# Patient Record
Sex: Male | Born: 1945 | Race: White | Hispanic: No | Marital: Married
Health system: Southern US, Community
[De-identification: ages and names within clinical notes are randomized; demographics above are authoritative.]

---

## 2003-10-13 ENCOUNTER — Ambulatory Visit (HOSPITAL_COMMUNITY): Admission: RE | Admit: 2003-10-13 | Discharge: 2003-10-13 | Payer: Self-pay | Admitting: Orthopedic Surgery

## 2004-02-24 ENCOUNTER — Ambulatory Visit (HOSPITAL_COMMUNITY): Admission: RE | Admit: 2004-02-24 | Discharge: 2004-02-24 | Payer: Self-pay | Admitting: Orthopedic Surgery

## 2011-10-03 ENCOUNTER — Encounter (INDEPENDENT_AMBULATORY_CARE_PROVIDER_SITE_OTHER): Payer: Self-pay | Admitting: *Deleted

## 2012-01-08 ENCOUNTER — Ambulatory Visit: Payer: Self-pay | Admitting: Pain Medicine

## 2012-01-08 LAB — BASIC METABOLIC PANEL
Anion Gap: 5 — ABNORMAL LOW (ref 7–16)
BUN: 4 mg/dL — ABNORMAL LOW (ref 7–18)
Co2: 31 mmol/L (ref 21–32)
Creatinine: 0.84 mg/dL (ref 0.60–1.30)
EGFR (African American): >60
EGFR (Non-African Amer.): >60
Glucose: 106 mg/dL — ABNORMAL HIGH (ref 65–99)
Sodium: 138 mmol/L (ref 136–145)

## 2012-01-28 ENCOUNTER — Ambulatory Visit: Payer: Self-pay | Admitting: Pain Medicine

## 2012-02-06 ENCOUNTER — Ambulatory Visit: Payer: Self-pay | Admitting: Pain Medicine

## 2014-07-05 NOTE — Op Note (Signed)
PATIENT NAME:  Dale Jones, Dale Jones MR#:  [id number] DATE OF BIRTH:  1945-03-23  DATE OF PROCEDURE:  01/28/2012  Location: Operating Room Referring Physician: Neomia Dear, MD Procedure by: Kathlen Brunswick Dossie Arbour, M.D.  Note: This is the case of a 69 year old morbidly obese white male patient who comes into same day surgery today for placement of a bilateral lumbar spinal cord stimulator as an outpatient. He previously had a trial done by Dr. Niel Hummer with excellent results and he has decided to proceed with a permanent implant.   Procedure(s):  1. Permanent implantation of Lumbar Epidural, Double Percutaneous Neurostimulator Leads (16 electrode array). 2. Implantation of Epidural Neurostimulator Generator. 3. Fluoroscopic Needle Guidance 4. Intraoperative Analysis and Programming. 5. Postoperative Analysis and Programming. 6. Moderate Conscious Sedation by the American Anesthesia Team.  Surgeon: Kathlen Brunswick. Dossie Arbour, M.D. Side of implant: Bilateral Top electrode tip level: Mid body of T7 (bilaterally). 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 Position: Prone.  Prepping solution: DuraPrep Area prepped: The thoracolumbar and sacral areas, were prepped with a broad-spectrum topical antiseptic microbicide. Target area: Lumbar Epidural Space, around the T7-8 vertebral body, for the electrode tip. Insertion site is the L2-3 intervertebral space. Level entered: L2-3. Number of attempts: One.  Infection Control: Standard Universal Precautions taken (Respiratory Hygiene/Cough Etiquette; Mouth, nose, eye protection; Hand Hygiene; Personal protective equipment (PPE); safe injection practices; and use of masks and disposable sterile surgical gloves) as recommended by the Department of [place] for Disease Control and Prevention (CDC).  Safety Measures: Allergies were reviewed. Appropriate site, procedure, and patient were confirmed by following the  Joint Commission's Universal Protocol (UP.01.01.01). The patient was asked to confirm marked site and procedure, before commencing. The patient was asked about blood thinners, or active infections, both of which were denied. No attempt was made at seeking any paresthesias. Aspiration looking for blood return was conducted prior to injecting. At no point did we inject any substances, as a needle was being advanced.  Pre-procedure Assessment:  A medical history and physical exam were obtained. Relevant documentation was reviewed and verified. Prior to the procedure, the patient was provided with an Audio CD, as well as written information on the procedure, including side-effects, and possible complications. Under the influence of no sedatives, a verbal, as well as a written informed consent were obtained, after having provided information on the risks and possible complications. To fulfill our ethical and legal obligations, as recommended by the American Medical Association's Code of Ethics, we have provided information to the patient about our clinical impression; the nature and purpose of an available treatment or procedure; the risks and benefits of an available treatment or procedure; alternatives; the risk and benefits of the alternative treatment or procedure; and the risks and benefits of not receiving or undergoing a treatment or procedure. The patient was provided information about the risks and possible complications associated with the procedure. These include, but not limited to, failure to achieve desired goals, infection, bleeding, organ or nerve damage, allergic reactions, paralysis, and death. In addition, the patient was informed that Medicine is not an exact science; therefore, there is also the possibility of unforeseen risks and possible complications that may result in a catastrophic outcome. The patient indicated having understood very clearly.  We have given the patient no guarantees and we  have made no promises. Ample time was given to the patient to ask questions, all of which were answered, to the patient's satisfaction, before proceeding. The patient understands that by signing our informed consent  form, they understand and accept the risks and the fact that it is impossible to predict all possible complications. Baseline vital signs were taken and the medical assessment was completed. Verification of the correct person, correct site (including marking of site), and correct procedure were performed and confirmed by the patient. Baseline vital signs were taken and the initial assessment was completed. Verification of the correct person, correct site (including marking of site), and correct procedure were performed and confirmed by the patient, in the form of a "Time Out".  Monitoring: The patient was monitored in the usual manner, using NIBPM, ECG, and pulse oximetry.  IV Access:  An IV access was obtained and secured.  Analgesia:  Moderate (Conscious) Intravenous sedation: Consent was obtained before administering any sedation. Availability of a responsible, adult driver, and NPO status confirmed. Meaningful verbal contact was maintained, with the patient at all times during the procedure. ASA Sedation Guidelines followed. For specifics on pharmacological type and quantity of sedation, please see nursing chart.  Prophylactic Antibiotics: Cefazolin (1st generation cephalosporin) 1 gm IVPB.  Local Anesthesia: Lidocaine 1%. The skin over the procedure site were infiltrated using a 3 ml Luer-Lok syringe with a 0.5 inch, 25-G needle. Deeper tissues were infiltrated using a 3.0 inch, 22-G spinal needle, under fluoroscopic guidance.  Fluoroscopy: The patient was taken to the operative suite, where the patient was placed in position for the procedure, over the fluoroscopy compatible table. Fluoroscopy was manipulated, using "Tunnel Vision Technique", to obtain the best possible view of the  target area, on the affected side. Parallax error was corrected before commencing the procedure. Gabor Racz's "Direction-Depth-Direction" technique was used to introduce the procedural needle under continuous pulsed fluoroscopic guidance. Once the target was reached, antero-posterior and lateral fluoroscopic views were taken to confirm needle placement in two planes.   Fluoroscopy time: Please see the patient's chart for details.  Description of the procedure: The procedure site was prepped using a broad-spectrum topical antiseptic. The area was then draped in the usual and standard manner. "Time-out" was performed as per JC Universal Protocol (UP.01.01.01).   A midline incision was made over the spinous processes, and hemostasis attained. The procedural needle was then introduced through the incision using Gabor Racz's "Direction-Depth-Direction" technique, under pulsed fluoroscopic guidance. No attempt was made at seeking a paresthesia. The paramidline approach was used to enter the posterior lumbar epidural space at a 30 degree angle, using "Loss-of-resistance Technique" with 3 ml of PF-NaCl (0.9% NSS) + 0.5 ml of air, in a 5 ml glass syringe, using a "loss-of-bounce technique", at the desired level. Correct needle placement was confirmed in the  antero-posterior and lateral fluoroscopic views. The epidural lead was gently introduced under real-time fluoroscopy, constantly assessing for pain or paresthesias, until the tip was observed to be at the target level, on the side ipsilateral to the pain. The placement of the second lead was accomplished in a similar fashion. Once the target was thought to have been reached, antero-posterior and lateral fluoroscopic views were taken to confirm electrode placement in two planes. Placement was tested until a comfortable stimulation pattern was observed over the usual painful area. Once the patient had assured [place] that the stimulation was in the correct pattern, and  distribution, we proceeded to remove the 15-G "Tuohy" epidural needle(s). This was done while observing the electrode tip(s) under real-time fluoroscopy to prevent movement. The patient was sedated again, and 1% lidocaine was infiltrated into the buttocks area, in order to create the generator pocket.  The extension was tunneled and connected to the generator. An impedance check was conducted after connecting all sections of the system. Once hemostasis was confirmed, both wounds were closed with Vicryl 2-0 after cleaning them with a solution containing 50:50 hydrogen peroxide and Betadine. Surgical staples were used to close the skin. The wounds were covered with sterile transparent bio-occlusive dressings, to easily assess any evidence of infection in the future.  The patient tolerated the entire procedure well. A repeat set of vitals were taken after the procedure and the patient was kept under observation until discharge criteria was met. The patient was provided with discharge instructions, including a section on how to identify potential problems. Should any problems arise concerning this procedure, the patient was given instructions to immediately contact us, without hesitation. The neurostimulator representative and I, both provided the patient with our Business cards containing our contact telephone numbers, and instructed the patient to contact either one of [place], at any time, should there be any problems or questions. In any case, we plan to contact the patient by telephone for a follow-up status report regarding this interventional procedure.  EBL: 10 mL.  Complications: No heme; no paresthesias.  Disposition: Return to clinics in 10-11 days for removal of staples and postoperative evaluation.  Additional Comments/Plan: None.  Equipment used:  1. Medtronic lead kit model W2976312, lot [id number]. 2. Medtronic lead kit model W2976312, lot [id number]. 3. Medtronic accessory kit model D2839973,  lot [id number].  4. Medtronic charging system model W4194017, serial [id number].  5. Medtronic accessory kit model D2839973, lot [id number].  6. Medtronic pocket sizer model T6711382, lot [id number]. 7. Medtronic patient programmer model J2399731, serial [id number]. 8. Medtronic accessory kit model Q5479962, lot [id number].  9. Medtronic accessory kit model Q5479962, lot [id number].  10. Medtronic generator model [id number], serial [id number].  Disclaimer: Medicine is not an Chief Strategy Officer. The only guarantee in medicine is that nothing is guaranteed. It is important to note that the decision to proceed with this intervention was based on the information collected from the patient. The Data and conclusions were drawn from the patient's questionnaire, the interview, and the physical examination. Because the information was provided in large part by the patient, it cannot be guaranteed that it has not been purposely or unconsciously manipulated. Every effort has been made to obtain as much relevant data as possible for this evaluation. It is important to note that the conclusions that lead to this procedure are derived in large part from the available data. Always take into account that the treatment will also be dependent on availability of resources and existing treatment guidelines, considered by other Pain Management Practitioners as being common knowledge and practice, at this time. For Medico-Legal purposes, it is also important to point out that variations in procedural techniques and pharmacological choices are the acceptable norm. The indications, contraindications, technique, and results of the above procedure should only be interpreted and judged by a Board-Certified Interventional Pain Specialist with extensive familiarity and expertise in the same exact procedure and technique, doing otherwise would be inappropriate and unethical. ____________________________ Kathlen Brunswick. Dossie Arbour, MD fan:slb D: 01/28/2012  15:57:00 ET T: 01/28/2012 17:14:32 ET JOB#: [id number]  cc: Tracey Hermance A. Dossie Arbour, MD, <Dictator> Neomia Dear, MD (FAX: [phone]) Gaspar Cola MD ELECTRONICALLY SIGNED 01/29/2012 16:31

## 2016-01-04 ENCOUNTER — Ambulatory Visit (INDEPENDENT_AMBULATORY_CARE_PROVIDER_SITE_OTHER): Payer: Medicare Other | Admitting: Otolaryngology

## 2016-01-04 DIAGNOSIS — H900 Conductive hearing loss, bilateral: Secondary | ICD-10-CM

## 2016-01-04 DIAGNOSIS — H6123 Impacted cerumen, bilateral: Secondary | ICD-10-CM | POA: Diagnosis not present

## 2016-05-02 ENCOUNTER — Ambulatory Visit (INDEPENDENT_AMBULATORY_CARE_PROVIDER_SITE_OTHER): Payer: Medicare Other | Admitting: Otolaryngology

## 2016-05-02 DIAGNOSIS — H6123 Impacted cerumen, bilateral: Secondary | ICD-10-CM

## 2016-07-29 ENCOUNTER — Ambulatory Visit (INDEPENDENT_AMBULATORY_CARE_PROVIDER_SITE_OTHER): Payer: Medicare Other | Admitting: Otolaryngology

## 2016-07-29 DIAGNOSIS — H6123 Impacted cerumen, bilateral: Secondary | ICD-10-CM | POA: Diagnosis not present

## 2016-12-02 ENCOUNTER — Ambulatory Visit (INDEPENDENT_AMBULATORY_CARE_PROVIDER_SITE_OTHER): Payer: Medicare Other | Admitting: Otolaryngology

## 2016-12-30 ENCOUNTER — Ambulatory Visit (INDEPENDENT_AMBULATORY_CARE_PROVIDER_SITE_OTHER): Payer: Medicare Other | Admitting: Otolaryngology

## 2017-03-17 ENCOUNTER — Ambulatory Visit (INDEPENDENT_AMBULATORY_CARE_PROVIDER_SITE_OTHER): Payer: Medicare Other | Admitting: Otolaryngology

## 2017-03-17 DIAGNOSIS — H6123 Impacted cerumen, bilateral: Secondary | ICD-10-CM | POA: Diagnosis not present

## 2017-06-12 ENCOUNTER — Ambulatory Visit (INDEPENDENT_AMBULATORY_CARE_PROVIDER_SITE_OTHER): Payer: Medicare Other | Admitting: Otolaryngology

## 2017-06-12 DIAGNOSIS — H6123 Impacted cerumen, bilateral: Secondary | ICD-10-CM | POA: Diagnosis not present

## 2017-09-04 ENCOUNTER — Ambulatory Visit (INDEPENDENT_AMBULATORY_CARE_PROVIDER_SITE_OTHER): Payer: Medicare Other | Admitting: Otolaryngology

## 2017-09-04 DIAGNOSIS — H6123 Impacted cerumen, bilateral: Secondary | ICD-10-CM

## 2017-12-04 ENCOUNTER — Ambulatory Visit (INDEPENDENT_AMBULATORY_CARE_PROVIDER_SITE_OTHER): Payer: Medicare Other | Admitting: Otolaryngology

## 2018-01-29 ENCOUNTER — Ambulatory Visit (INDEPENDENT_AMBULATORY_CARE_PROVIDER_SITE_OTHER): Payer: Medicare Other | Admitting: Otolaryngology

## 2018-01-29 DIAGNOSIS — H6123 Impacted cerumen, bilateral: Secondary | ICD-10-CM

## 2018-04-02 ENCOUNTER — Ambulatory Visit (INDEPENDENT_AMBULATORY_CARE_PROVIDER_SITE_OTHER): Payer: Medicare Other | Admitting: Otolaryngology

## 2018-04-02 DIAGNOSIS — H6123 Impacted cerumen, bilateral: Secondary | ICD-10-CM

## 2018-10-26 ENCOUNTER — Ambulatory Visit (INDEPENDENT_AMBULATORY_CARE_PROVIDER_SITE_OTHER): Payer: Medicare Other | Admitting: Otolaryngology

## 2018-11-26 ENCOUNTER — Ambulatory Visit (INDEPENDENT_AMBULATORY_CARE_PROVIDER_SITE_OTHER): Payer: Medicare Other | Admitting: Otolaryngology

## 2018-11-26 DIAGNOSIS — H6123 Impacted cerumen, bilateral: Secondary | ICD-10-CM | POA: Diagnosis not present

## 2019-04-08 DIAGNOSIS — M1990 Unspecified osteoarthritis, unspecified site: Secondary | ICD-10-CM | POA: Diagnosis not present

## 2019-04-08 DIAGNOSIS — I639 Cerebral infarction, unspecified: Secondary | ICD-10-CM | POA: Diagnosis not present

## 2019-04-08 DIAGNOSIS — M47816 Spondylosis without myelopathy or radiculopathy, lumbar region: Secondary | ICD-10-CM | POA: Diagnosis not present

## 2019-04-08 DIAGNOSIS — G8194 Hemiplegia, unspecified affecting left nondominant side: Secondary | ICD-10-CM | POA: Diagnosis not present

## 2019-04-08 DIAGNOSIS — R251 Tremor, unspecified: Secondary | ICD-10-CM | POA: Diagnosis not present

## 2019-05-09 DIAGNOSIS — G8194 Hemiplegia, unspecified affecting left nondominant side: Secondary | ICD-10-CM | POA: Diagnosis not present

## 2019-05-09 DIAGNOSIS — I639 Cerebral infarction, unspecified: Secondary | ICD-10-CM | POA: Diagnosis not present

## 2019-05-09 DIAGNOSIS — M1990 Unspecified osteoarthritis, unspecified site: Secondary | ICD-10-CM | POA: Diagnosis not present

## 2019-05-09 DIAGNOSIS — R251 Tremor, unspecified: Secondary | ICD-10-CM | POA: Diagnosis not present

## 2019-05-09 DIAGNOSIS — M47816 Spondylosis without myelopathy or radiculopathy, lumbar region: Secondary | ICD-10-CM | POA: Diagnosis not present

## 2019-05-27 DIAGNOSIS — I10 Essential (primary) hypertension: Secondary | ICD-10-CM | POA: Diagnosis not present

## 2019-05-27 DIAGNOSIS — R5383 Other fatigue: Secondary | ICD-10-CM | POA: Diagnosis not present

## 2019-05-27 DIAGNOSIS — Z79899 Other long term (current) drug therapy: Secondary | ICD-10-CM | POA: Diagnosis not present

## 2019-05-27 DIAGNOSIS — M79605 Pain in left leg: Secondary | ICD-10-CM | POA: Diagnosis not present

## 2019-05-27 DIAGNOSIS — I69351 Hemiplegia and hemiparesis following cerebral infarction affecting right dominant side: Secondary | ICD-10-CM | POA: Diagnosis not present

## 2019-05-27 DIAGNOSIS — Z299 Encounter for prophylactic measures, unspecified: Secondary | ICD-10-CM | POA: Diagnosis not present

## 2019-06-01 DIAGNOSIS — S7012XA Contusion of left thigh, initial encounter: Secondary | ICD-10-CM | POA: Diagnosis not present

## 2019-06-01 DIAGNOSIS — I10 Essential (primary) hypertension: Secondary | ICD-10-CM | POA: Diagnosis not present

## 2019-06-01 DIAGNOSIS — Z299 Encounter for prophylactic measures, unspecified: Secondary | ICD-10-CM | POA: Diagnosis not present

## 2019-06-02 DIAGNOSIS — E7800 Pure hypercholesterolemia, unspecified: Secondary | ICD-10-CM | POA: Diagnosis not present

## 2019-06-02 DIAGNOSIS — I10 Essential (primary) hypertension: Secondary | ICD-10-CM | POA: Diagnosis not present

## 2019-06-02 DIAGNOSIS — E119 Type 2 diabetes mellitus without complications: Secondary | ICD-10-CM | POA: Diagnosis not present

## 2019-06-02 DIAGNOSIS — M159 Polyosteoarthritis, unspecified: Secondary | ICD-10-CM | POA: Diagnosis not present

## 2019-06-06 DIAGNOSIS — R251 Tremor, unspecified: Secondary | ICD-10-CM | POA: Diagnosis not present

## 2019-06-06 DIAGNOSIS — I639 Cerebral infarction, unspecified: Secondary | ICD-10-CM | POA: Diagnosis not present

## 2019-06-06 DIAGNOSIS — M47816 Spondylosis without myelopathy or radiculopathy, lumbar region: Secondary | ICD-10-CM | POA: Diagnosis not present

## 2019-06-06 DIAGNOSIS — M1990 Unspecified osteoarthritis, unspecified site: Secondary | ICD-10-CM | POA: Diagnosis not present

## 2019-06-06 DIAGNOSIS — G8194 Hemiplegia, unspecified affecting left nondominant side: Secondary | ICD-10-CM | POA: Diagnosis not present

## 2019-06-09 DIAGNOSIS — M79605 Pain in left leg: Secondary | ICD-10-CM | POA: Diagnosis not present

## 2019-06-09 DIAGNOSIS — I8392 Asymptomatic varicose veins of left lower extremity: Secondary | ICD-10-CM | POA: Diagnosis not present

## 2019-06-09 DIAGNOSIS — S7012XA Contusion of left thigh, initial encounter: Secondary | ICD-10-CM | POA: Diagnosis not present

## 2019-06-10 DIAGNOSIS — Z85828 Personal history of other malignant neoplasm of skin: Secondary | ICD-10-CM | POA: Diagnosis not present

## 2019-06-10 DIAGNOSIS — R3912 Poor urinary stream: Secondary | ICD-10-CM | POA: Diagnosis not present

## 2019-06-10 DIAGNOSIS — Z7902 Long term (current) use of antithrombotics/antiplatelets: Secondary | ICD-10-CM | POA: Diagnosis not present

## 2019-06-10 DIAGNOSIS — Z7982 Long term (current) use of aspirin: Secondary | ICD-10-CM | POA: Diagnosis not present

## 2019-06-10 DIAGNOSIS — E039 Hypothyroidism, unspecified: Secondary | ICD-10-CM | POA: Diagnosis not present

## 2019-06-10 DIAGNOSIS — N390 Urinary tract infection, site not specified: Secondary | ICD-10-CM | POA: Diagnosis not present

## 2019-06-10 DIAGNOSIS — I10 Essential (primary) hypertension: Secondary | ICD-10-CM | POA: Diagnosis not present

## 2019-06-10 DIAGNOSIS — Z8673 Personal history of transient ischemic attack (TIA), and cerebral infarction without residual deficits: Secondary | ICD-10-CM | POA: Diagnosis not present

## 2019-06-10 DIAGNOSIS — R339 Retention of urine, unspecified: Secondary | ICD-10-CM | POA: Diagnosis not present

## 2019-06-10 DIAGNOSIS — Z8249 Family history of ischemic heart disease and other diseases of the circulatory system: Secondary | ICD-10-CM | POA: Diagnosis not present

## 2019-06-10 DIAGNOSIS — E860 Dehydration: Secondary | ICD-10-CM | POA: Diagnosis not present

## 2019-06-10 DIAGNOSIS — Z79899 Other long term (current) drug therapy: Secondary | ICD-10-CM | POA: Diagnosis not present

## 2019-06-10 DIAGNOSIS — Z5321 Procedure and treatment not carried out due to patient leaving prior to being seen by health care provider: Secondary | ICD-10-CM | POA: Diagnosis not present

## 2019-06-10 DIAGNOSIS — Z299 Encounter for prophylactic measures, unspecified: Secondary | ICD-10-CM | POA: Diagnosis not present

## 2019-06-24 DIAGNOSIS — E119 Type 2 diabetes mellitus without complications: Secondary | ICD-10-CM | POA: Diagnosis not present

## 2019-06-24 DIAGNOSIS — E7800 Pure hypercholesterolemia, unspecified: Secondary | ICD-10-CM | POA: Diagnosis not present

## 2019-06-24 DIAGNOSIS — M159 Polyosteoarthritis, unspecified: Secondary | ICD-10-CM | POA: Diagnosis not present

## 2019-06-24 DIAGNOSIS — I10 Essential (primary) hypertension: Secondary | ICD-10-CM | POA: Diagnosis not present

## 2019-07-05 DIAGNOSIS — Z299 Encounter for prophylactic measures, unspecified: Secondary | ICD-10-CM | POA: Diagnosis not present

## 2019-07-05 DIAGNOSIS — W19XXXA Unspecified fall, initial encounter: Secondary | ICD-10-CM | POA: Diagnosis not present

## 2019-07-05 DIAGNOSIS — I10 Essential (primary) hypertension: Secondary | ICD-10-CM | POA: Diagnosis not present

## 2019-07-19 DIAGNOSIS — R918 Other nonspecific abnormal finding of lung field: Secondary | ICD-10-CM | POA: Diagnosis not present

## 2019-07-19 DIAGNOSIS — I10 Essential (primary) hypertension: Secondary | ICD-10-CM | POA: Diagnosis not present

## 2019-07-19 DIAGNOSIS — Z6822 Body mass index (BMI) 22.0-22.9, adult: Secondary | ICD-10-CM | POA: Diagnosis not present

## 2019-07-19 DIAGNOSIS — R6521 Severe sepsis with septic shock: Secondary | ICD-10-CM | POA: Diagnosis not present

## 2019-07-19 DIAGNOSIS — B957 Other staphylococcus as the cause of diseases classified elsewhere: Secondary | ICD-10-CM | POA: Diagnosis not present

## 2019-07-19 DIAGNOSIS — M6281 Muscle weakness (generalized): Secondary | ICD-10-CM | POA: Diagnosis not present

## 2019-07-19 DIAGNOSIS — R109 Unspecified abdominal pain: Secondary | ICD-10-CM | POA: Diagnosis not present

## 2019-07-19 DIAGNOSIS — R031 Nonspecific low blood-pressure reading: Secondary | ICD-10-CM | POA: Diagnosis not present

## 2019-07-19 DIAGNOSIS — Z66 Do not resuscitate: Secondary | ICD-10-CM | POA: Diagnosis not present

## 2019-07-19 DIAGNOSIS — R2689 Other abnormalities of gait and mobility: Secondary | ICD-10-CM | POA: Diagnosis not present

## 2019-07-19 DIAGNOSIS — R404 Transient alteration of awareness: Secondary | ICD-10-CM | POA: Diagnosis not present

## 2019-07-19 DIAGNOSIS — I959 Hypotension, unspecified: Secondary | ICD-10-CM | POA: Diagnosis not present

## 2019-07-19 DIAGNOSIS — Z8673 Personal history of transient ischemic attack (TIA), and cerebral infarction without residual deficits: Secondary | ICD-10-CM | POA: Diagnosis not present

## 2019-07-19 DIAGNOSIS — R627 Adult failure to thrive: Secondary | ICD-10-CM | POA: Diagnosis not present

## 2019-07-19 DIAGNOSIS — R7881 Bacteremia: Secondary | ICD-10-CM | POA: Diagnosis not present

## 2019-07-19 DIAGNOSIS — E860 Dehydration: Secondary | ICD-10-CM | POA: Diagnosis not present

## 2019-07-19 DIAGNOSIS — Z87440 Personal history of urinary (tract) infections: Secondary | ICD-10-CM | POA: Diagnosis not present

## 2019-07-19 DIAGNOSIS — R1312 Dysphagia, oropharyngeal phase: Secondary | ICD-10-CM | POA: Diagnosis not present

## 2019-07-19 DIAGNOSIS — R0902 Hypoxemia: Secondary | ICD-10-CM | POA: Diagnosis not present

## 2019-07-19 DIAGNOSIS — E039 Hypothyroidism, unspecified: Secondary | ICD-10-CM | POA: Diagnosis not present

## 2019-07-19 DIAGNOSIS — A419 Sepsis, unspecified organism: Secondary | ICD-10-CM | POA: Diagnosis not present

## 2019-07-19 DIAGNOSIS — B9561 Methicillin susceptible Staphylococcus aureus infection as the cause of diseases classified elsewhere: Secondary | ICD-10-CM | POA: Diagnosis not present

## 2019-07-19 DIAGNOSIS — B9620 Unspecified Escherichia coli [E. coli] as the cause of diseases classified elsewhere: Secondary | ICD-10-CM | POA: Diagnosis not present

## 2019-07-19 DIAGNOSIS — B9562 Methicillin resistant Staphylococcus aureus infection as the cause of diseases classified elsewhere: Secondary | ICD-10-CM | POA: Diagnosis not present

## 2019-07-19 DIAGNOSIS — Z20822 Contact with and (suspected) exposure to covid-19: Secondary | ICD-10-CM | POA: Diagnosis not present

## 2019-07-19 DIAGNOSIS — Z7982 Long term (current) use of aspirin: Secondary | ICD-10-CM | POA: Diagnosis not present

## 2019-07-19 DIAGNOSIS — R531 Weakness: Secondary | ICD-10-CM | POA: Diagnosis not present

## 2019-07-19 DIAGNOSIS — I69391 Dysphagia following cerebral infarction: Secondary | ICD-10-CM | POA: Diagnosis not present

## 2019-07-19 DIAGNOSIS — N179 Acute kidney failure, unspecified: Secondary | ICD-10-CM | POA: Diagnosis not present

## 2019-07-19 DIAGNOSIS — Z4659 Encounter for fitting and adjustment of other gastrointestinal appliance and device: Secondary | ICD-10-CM | POA: Diagnosis not present

## 2019-07-19 DIAGNOSIS — Z7401 Bed confinement status: Secondary | ICD-10-CM | POA: Diagnosis not present

## 2019-07-19 DIAGNOSIS — E43 Unspecified severe protein-calorie malnutrition: Secondary | ICD-10-CM | POA: Diagnosis not present

## 2019-07-19 DIAGNOSIS — N390 Urinary tract infection, site not specified: Secondary | ICD-10-CM | POA: Diagnosis not present

## 2019-07-19 DIAGNOSIS — D72829 Elevated white blood cell count, unspecified: Secondary | ICD-10-CM | POA: Diagnosis not present

## 2019-07-19 DIAGNOSIS — E870 Hyperosmolality and hypernatremia: Secondary | ICD-10-CM | POA: Diagnosis not present

## 2019-07-19 DIAGNOSIS — Z931 Gastrostomy status: Secondary | ICD-10-CM | POA: Diagnosis not present

## 2019-07-19 DIAGNOSIS — R1310 Dysphagia, unspecified: Secondary | ICD-10-CM | POA: Diagnosis not present

## 2019-07-19 DIAGNOSIS — Z7902 Long term (current) use of antithrombotics/antiplatelets: Secondary | ICD-10-CM | POA: Diagnosis not present

## 2019-07-30 DIAGNOSIS — Z931 Gastrostomy status: Secondary | ICD-10-CM | POA: Diagnosis not present

## 2019-07-30 DIAGNOSIS — G8929 Other chronic pain: Secondary | ICD-10-CM | POA: Diagnosis not present

## 2019-07-30 DIAGNOSIS — I10 Essential (primary) hypertension: Secondary | ICD-10-CM | POA: Diagnosis not present

## 2019-07-30 DIAGNOSIS — A419 Sepsis, unspecified organism: Secondary | ICD-10-CM | POA: Diagnosis not present

## 2019-07-30 DIAGNOSIS — I69351 Hemiplegia and hemiparesis following cerebral infarction affecting right dominant side: Secondary | ICD-10-CM | POA: Diagnosis not present

## 2019-07-30 DIAGNOSIS — R627 Adult failure to thrive: Secondary | ICD-10-CM | POA: Diagnosis not present

## 2019-07-30 DIAGNOSIS — M6281 Muscle weakness (generalized): Secondary | ICD-10-CM | POA: Diagnosis not present

## 2019-07-30 DIAGNOSIS — I69391 Dysphagia following cerebral infarction: Secondary | ICD-10-CM | POA: Diagnosis not present

## 2019-07-30 DIAGNOSIS — E870 Hyperosmolality and hypernatremia: Secondary | ICD-10-CM | POA: Diagnosis not present

## 2019-07-30 DIAGNOSIS — Z0000 Encounter for general adult medical examination without abnormal findings: Secondary | ICD-10-CM | POA: Diagnosis not present

## 2019-07-30 DIAGNOSIS — N179 Acute kidney failure, unspecified: Secondary | ICD-10-CM | POA: Diagnosis not present

## 2019-07-30 DIAGNOSIS — N390 Urinary tract infection, site not specified: Secondary | ICD-10-CM | POA: Diagnosis not present

## 2019-07-30 DIAGNOSIS — R1312 Dysphagia, oropharyngeal phase: Secondary | ICD-10-CM | POA: Diagnosis not present

## 2019-07-30 DIAGNOSIS — E43 Unspecified severe protein-calorie malnutrition: Secondary | ICD-10-CM | POA: Diagnosis not present

## 2019-07-30 DIAGNOSIS — Z7401 Bed confinement status: Secondary | ICD-10-CM | POA: Diagnosis not present

## 2019-07-30 DIAGNOSIS — B9620 Unspecified Escherichia coli [E. coli] as the cause of diseases classified elsewhere: Secondary | ICD-10-CM | POA: Diagnosis not present

## 2019-07-30 DIAGNOSIS — Z299 Encounter for prophylactic measures, unspecified: Secondary | ICD-10-CM | POA: Diagnosis not present

## 2019-07-30 DIAGNOSIS — R2689 Other abnormalities of gait and mobility: Secondary | ICD-10-CM | POA: Diagnosis not present

## 2019-07-30 DIAGNOSIS — D692 Other nonthrombocytopenic purpura: Secondary | ICD-10-CM | POA: Diagnosis not present

## 2019-07-30 DIAGNOSIS — E039 Hypothyroidism, unspecified: Secondary | ICD-10-CM | POA: Diagnosis not present

## 2019-08-10 DIAGNOSIS — Z299 Encounter for prophylactic measures, unspecified: Secondary | ICD-10-CM | POA: Diagnosis not present

## 2019-08-10 DIAGNOSIS — I69351 Hemiplegia and hemiparesis following cerebral infarction affecting right dominant side: Secondary | ICD-10-CM | POA: Diagnosis not present

## 2019-08-10 DIAGNOSIS — I10 Essential (primary) hypertension: Secondary | ICD-10-CM | POA: Diagnosis not present

## 2019-08-20 DIAGNOSIS — G8929 Other chronic pain: Secondary | ICD-10-CM | POA: Diagnosis not present

## 2019-08-20 DIAGNOSIS — I10 Essential (primary) hypertension: Secondary | ICD-10-CM | POA: Diagnosis not present

## 2019-08-20 DIAGNOSIS — E039 Hypothyroidism, unspecified: Secondary | ICD-10-CM | POA: Diagnosis not present

## 2019-08-20 DIAGNOSIS — I69351 Hemiplegia and hemiparesis following cerebral infarction affecting right dominant side: Secondary | ICD-10-CM | POA: Diagnosis not present

## 2019-08-20 DIAGNOSIS — D692 Other nonthrombocytopenic purpura: Secondary | ICD-10-CM | POA: Diagnosis not present

## 2019-08-23 DIAGNOSIS — L89153 Pressure ulcer of sacral region, stage 3: Secondary | ICD-10-CM | POA: Diagnosis not present

## 2019-08-23 DIAGNOSIS — R1312 Dysphagia, oropharyngeal phase: Secondary | ICD-10-CM | POA: Diagnosis not present

## 2019-08-23 DIAGNOSIS — R471 Dysarthria and anarthria: Secondary | ICD-10-CM | POA: Diagnosis not present

## 2019-08-23 DIAGNOSIS — Z931 Gastrostomy status: Secondary | ICD-10-CM | POA: Diagnosis not present

## 2019-08-23 DIAGNOSIS — M545 Low back pain: Secondary | ICD-10-CM | POA: Diagnosis not present

## 2019-08-23 DIAGNOSIS — G8929 Other chronic pain: Secondary | ICD-10-CM | POA: Diagnosis not present

## 2019-08-23 DIAGNOSIS — G8194 Hemiplegia, unspecified affecting left nondominant side: Secondary | ICD-10-CM | POA: Diagnosis not present

## 2019-08-23 DIAGNOSIS — L98419 Non-pressure chronic ulcer of buttock with unspecified severity: Secondary | ICD-10-CM | POA: Diagnosis not present

## 2019-08-23 DIAGNOSIS — R627 Adult failure to thrive: Secondary | ICD-10-CM | POA: Diagnosis not present

## 2019-08-23 DIAGNOSIS — I69391 Dysphagia following cerebral infarction: Secondary | ICD-10-CM | POA: Diagnosis not present

## 2019-08-23 DIAGNOSIS — Z09 Encounter for follow-up examination after completed treatment for conditions other than malignant neoplasm: Secondary | ICD-10-CM | POA: Diagnosis not present

## 2019-08-23 DIAGNOSIS — Z431 Encounter for attention to gastrostomy: Secondary | ICD-10-CM | POA: Diagnosis not present

## 2019-08-23 DIAGNOSIS — I959 Hypotension, unspecified: Secondary | ICD-10-CM | POA: Diagnosis not present

## 2019-08-23 DIAGNOSIS — E43 Unspecified severe protein-calorie malnutrition: Secondary | ICD-10-CM | POA: Diagnosis not present

## 2019-08-23 DIAGNOSIS — I10 Essential (primary) hypertension: Secondary | ICD-10-CM | POA: Diagnosis not present

## 2019-08-24 DIAGNOSIS — I69391 Dysphagia following cerebral infarction: Secondary | ICD-10-CM | POA: Diagnosis not present

## 2019-08-24 DIAGNOSIS — G8929 Other chronic pain: Secondary | ICD-10-CM | POA: Diagnosis not present

## 2019-08-24 DIAGNOSIS — R1312 Dysphagia, oropharyngeal phase: Secondary | ICD-10-CM | POA: Diagnosis not present

## 2019-08-24 DIAGNOSIS — L89153 Pressure ulcer of sacral region, stage 3: Secondary | ICD-10-CM | POA: Diagnosis not present

## 2019-08-24 DIAGNOSIS — R627 Adult failure to thrive: Secondary | ICD-10-CM | POA: Diagnosis not present

## 2019-08-24 DIAGNOSIS — A419 Sepsis, unspecified organism: Secondary | ICD-10-CM | POA: Diagnosis not present

## 2019-08-24 DIAGNOSIS — Z431 Encounter for attention to gastrostomy: Secondary | ICD-10-CM | POA: Diagnosis not present

## 2019-08-24 DIAGNOSIS — E43 Unspecified severe protein-calorie malnutrition: Secondary | ICD-10-CM | POA: Diagnosis not present

## 2019-08-24 DIAGNOSIS — M545 Low back pain: Secondary | ICD-10-CM | POA: Diagnosis not present

## 2019-08-24 DIAGNOSIS — Z931 Gastrostomy status: Secondary | ICD-10-CM | POA: Diagnosis not present

## 2019-08-24 DIAGNOSIS — I10 Essential (primary) hypertension: Secondary | ICD-10-CM | POA: Diagnosis not present

## 2019-08-24 DIAGNOSIS — I959 Hypotension, unspecified: Secondary | ICD-10-CM | POA: Diagnosis not present

## 2019-08-24 DIAGNOSIS — R471 Dysarthria and anarthria: Secondary | ICD-10-CM | POA: Diagnosis not present

## 2019-08-25 DIAGNOSIS — E43 Unspecified severe protein-calorie malnutrition: Secondary | ICD-10-CM | POA: Diagnosis not present

## 2019-08-25 DIAGNOSIS — R471 Dysarthria and anarthria: Secondary | ICD-10-CM | POA: Diagnosis not present

## 2019-08-25 DIAGNOSIS — Z431 Encounter for attention to gastrostomy: Secondary | ICD-10-CM | POA: Diagnosis not present

## 2019-08-25 DIAGNOSIS — L89153 Pressure ulcer of sacral region, stage 3: Secondary | ICD-10-CM | POA: Diagnosis not present

## 2019-08-25 DIAGNOSIS — I959 Hypotension, unspecified: Secondary | ICD-10-CM | POA: Diagnosis not present

## 2019-08-25 DIAGNOSIS — G8929 Other chronic pain: Secondary | ICD-10-CM | POA: Diagnosis not present

## 2019-08-25 DIAGNOSIS — I69391 Dysphagia following cerebral infarction: Secondary | ICD-10-CM | POA: Diagnosis not present

## 2019-08-25 DIAGNOSIS — R627 Adult failure to thrive: Secondary | ICD-10-CM | POA: Diagnosis not present

## 2019-08-25 DIAGNOSIS — R1312 Dysphagia, oropharyngeal phase: Secondary | ICD-10-CM | POA: Diagnosis not present

## 2019-08-25 DIAGNOSIS — M545 Low back pain: Secondary | ICD-10-CM | POA: Diagnosis not present

## 2019-08-25 DIAGNOSIS — I10 Essential (primary) hypertension: Secondary | ICD-10-CM | POA: Diagnosis not present

## 2019-08-26 DIAGNOSIS — I69391 Dysphagia following cerebral infarction: Secondary | ICD-10-CM | POA: Diagnosis not present

## 2019-08-26 DIAGNOSIS — G8929 Other chronic pain: Secondary | ICD-10-CM | POA: Diagnosis not present

## 2019-08-26 DIAGNOSIS — E43 Unspecified severe protein-calorie malnutrition: Secondary | ICD-10-CM | POA: Diagnosis not present

## 2019-08-26 DIAGNOSIS — L89153 Pressure ulcer of sacral region, stage 3: Secondary | ICD-10-CM | POA: Diagnosis not present

## 2019-08-26 DIAGNOSIS — R1312 Dysphagia, oropharyngeal phase: Secondary | ICD-10-CM | POA: Diagnosis not present

## 2019-08-26 DIAGNOSIS — R627 Adult failure to thrive: Secondary | ICD-10-CM | POA: Diagnosis not present

## 2019-08-26 DIAGNOSIS — I959 Hypotension, unspecified: Secondary | ICD-10-CM | POA: Diagnosis not present

## 2019-08-26 DIAGNOSIS — R471 Dysarthria and anarthria: Secondary | ICD-10-CM | POA: Diagnosis not present

## 2019-08-26 DIAGNOSIS — M545 Low back pain: Secondary | ICD-10-CM | POA: Diagnosis not present

## 2019-08-26 DIAGNOSIS — I10 Essential (primary) hypertension: Secondary | ICD-10-CM | POA: Diagnosis not present

## 2019-08-26 DIAGNOSIS — Z431 Encounter for attention to gastrostomy: Secondary | ICD-10-CM | POA: Diagnosis not present

## 2019-08-27 DIAGNOSIS — I959 Hypotension, unspecified: Secondary | ICD-10-CM | POA: Diagnosis not present

## 2019-08-27 DIAGNOSIS — R627 Adult failure to thrive: Secondary | ICD-10-CM | POA: Diagnosis not present

## 2019-08-27 DIAGNOSIS — I10 Essential (primary) hypertension: Secondary | ICD-10-CM | POA: Diagnosis not present

## 2019-08-27 DIAGNOSIS — G8929 Other chronic pain: Secondary | ICD-10-CM | POA: Diagnosis not present

## 2019-08-27 DIAGNOSIS — R471 Dysarthria and anarthria: Secondary | ICD-10-CM | POA: Diagnosis not present

## 2019-08-27 DIAGNOSIS — R1312 Dysphagia, oropharyngeal phase: Secondary | ICD-10-CM | POA: Diagnosis not present

## 2019-08-27 DIAGNOSIS — Z431 Encounter for attention to gastrostomy: Secondary | ICD-10-CM | POA: Diagnosis not present

## 2019-08-27 DIAGNOSIS — L89153 Pressure ulcer of sacral region, stage 3: Secondary | ICD-10-CM | POA: Diagnosis not present

## 2019-08-27 DIAGNOSIS — M545 Low back pain: Secondary | ICD-10-CM | POA: Diagnosis not present

## 2019-08-27 DIAGNOSIS — I69391 Dysphagia following cerebral infarction: Secondary | ICD-10-CM | POA: Diagnosis not present

## 2019-08-27 DIAGNOSIS — E43 Unspecified severe protein-calorie malnutrition: Secondary | ICD-10-CM | POA: Diagnosis not present

## 2019-08-30 DIAGNOSIS — L89153 Pressure ulcer of sacral region, stage 3: Secondary | ICD-10-CM | POA: Diagnosis not present

## 2019-08-30 DIAGNOSIS — I10 Essential (primary) hypertension: Secondary | ICD-10-CM | POA: Diagnosis not present

## 2019-08-30 DIAGNOSIS — R1312 Dysphagia, oropharyngeal phase: Secondary | ICD-10-CM | POA: Diagnosis not present

## 2019-08-30 DIAGNOSIS — Z431 Encounter for attention to gastrostomy: Secondary | ICD-10-CM | POA: Diagnosis not present

## 2019-08-30 DIAGNOSIS — G8929 Other chronic pain: Secondary | ICD-10-CM | POA: Diagnosis not present

## 2019-08-30 DIAGNOSIS — M545 Low back pain: Secondary | ICD-10-CM | POA: Diagnosis not present

## 2019-08-30 DIAGNOSIS — I69391 Dysphagia following cerebral infarction: Secondary | ICD-10-CM | POA: Diagnosis not present

## 2019-08-30 DIAGNOSIS — E43 Unspecified severe protein-calorie malnutrition: Secondary | ICD-10-CM | POA: Diagnosis not present

## 2019-08-30 DIAGNOSIS — R471 Dysarthria and anarthria: Secondary | ICD-10-CM | POA: Diagnosis not present

## 2019-08-30 DIAGNOSIS — I959 Hypotension, unspecified: Secondary | ICD-10-CM | POA: Diagnosis not present

## 2019-08-30 DIAGNOSIS — R627 Adult failure to thrive: Secondary | ICD-10-CM | POA: Diagnosis not present

## 2019-08-31 DIAGNOSIS — I959 Hypotension, unspecified: Secondary | ICD-10-CM | POA: Diagnosis not present

## 2019-08-31 DIAGNOSIS — I10 Essential (primary) hypertension: Secondary | ICD-10-CM | POA: Diagnosis not present

## 2019-08-31 DIAGNOSIS — I69391 Dysphagia following cerebral infarction: Secondary | ICD-10-CM | POA: Diagnosis not present

## 2019-08-31 DIAGNOSIS — R627 Adult failure to thrive: Secondary | ICD-10-CM | POA: Diagnosis not present

## 2019-08-31 DIAGNOSIS — R471 Dysarthria and anarthria: Secondary | ICD-10-CM | POA: Diagnosis not present

## 2019-08-31 DIAGNOSIS — G8929 Other chronic pain: Secondary | ICD-10-CM | POA: Diagnosis not present

## 2019-08-31 DIAGNOSIS — Z431 Encounter for attention to gastrostomy: Secondary | ICD-10-CM | POA: Diagnosis not present

## 2019-08-31 DIAGNOSIS — M545 Low back pain: Secondary | ICD-10-CM | POA: Diagnosis not present

## 2019-08-31 DIAGNOSIS — R1312 Dysphagia, oropharyngeal phase: Secondary | ICD-10-CM | POA: Diagnosis not present

## 2019-08-31 DIAGNOSIS — E43 Unspecified severe protein-calorie malnutrition: Secondary | ICD-10-CM | POA: Diagnosis not present

## 2019-08-31 DIAGNOSIS — L89153 Pressure ulcer of sacral region, stage 3: Secondary | ICD-10-CM | POA: Diagnosis not present

## 2019-09-01 DIAGNOSIS — R627 Adult failure to thrive: Secondary | ICD-10-CM | POA: Diagnosis not present

## 2019-09-01 DIAGNOSIS — I10 Essential (primary) hypertension: Secondary | ICD-10-CM | POA: Diagnosis not present

## 2019-09-01 DIAGNOSIS — R1312 Dysphagia, oropharyngeal phase: Secondary | ICD-10-CM | POA: Diagnosis not present

## 2019-09-01 DIAGNOSIS — R471 Dysarthria and anarthria: Secondary | ICD-10-CM | POA: Diagnosis not present

## 2019-09-01 DIAGNOSIS — Z431 Encounter for attention to gastrostomy: Secondary | ICD-10-CM | POA: Diagnosis not present

## 2019-09-01 DIAGNOSIS — G8929 Other chronic pain: Secondary | ICD-10-CM | POA: Diagnosis not present

## 2019-09-01 DIAGNOSIS — I959 Hypotension, unspecified: Secondary | ICD-10-CM | POA: Diagnosis not present

## 2019-09-01 DIAGNOSIS — M545 Low back pain: Secondary | ICD-10-CM | POA: Diagnosis not present

## 2019-09-01 DIAGNOSIS — I69391 Dysphagia following cerebral infarction: Secondary | ICD-10-CM | POA: Diagnosis not present

## 2019-09-01 DIAGNOSIS — E43 Unspecified severe protein-calorie malnutrition: Secondary | ICD-10-CM | POA: Diagnosis not present

## 2019-09-01 DIAGNOSIS — L89153 Pressure ulcer of sacral region, stage 3: Secondary | ICD-10-CM | POA: Diagnosis not present

## 2019-09-02 DIAGNOSIS — G8929 Other chronic pain: Secondary | ICD-10-CM | POA: Diagnosis not present

## 2019-09-02 DIAGNOSIS — M545 Low back pain: Secondary | ICD-10-CM | POA: Diagnosis not present

## 2019-09-02 DIAGNOSIS — I69391 Dysphagia following cerebral infarction: Secondary | ICD-10-CM | POA: Diagnosis not present

## 2019-09-02 DIAGNOSIS — R627 Adult failure to thrive: Secondary | ICD-10-CM | POA: Diagnosis not present

## 2019-09-02 DIAGNOSIS — E43 Unspecified severe protein-calorie malnutrition: Secondary | ICD-10-CM | POA: Diagnosis not present

## 2019-09-02 DIAGNOSIS — R1312 Dysphagia, oropharyngeal phase: Secondary | ICD-10-CM | POA: Diagnosis not present

## 2019-09-02 DIAGNOSIS — I959 Hypotension, unspecified: Secondary | ICD-10-CM | POA: Diagnosis not present

## 2019-09-02 DIAGNOSIS — Z431 Encounter for attention to gastrostomy: Secondary | ICD-10-CM | POA: Diagnosis not present

## 2019-09-02 DIAGNOSIS — L89153 Pressure ulcer of sacral region, stage 3: Secondary | ICD-10-CM | POA: Diagnosis not present

## 2019-09-02 DIAGNOSIS — I10 Essential (primary) hypertension: Secondary | ICD-10-CM | POA: Diagnosis not present

## 2019-09-02 DIAGNOSIS — R471 Dysarthria and anarthria: Secondary | ICD-10-CM | POA: Diagnosis not present

## 2019-09-06 DIAGNOSIS — L89153 Pressure ulcer of sacral region, stage 3: Secondary | ICD-10-CM | POA: Diagnosis not present

## 2019-09-06 DIAGNOSIS — I959 Hypotension, unspecified: Secondary | ICD-10-CM | POA: Diagnosis not present

## 2019-09-06 DIAGNOSIS — I10 Essential (primary) hypertension: Secondary | ICD-10-CM | POA: Diagnosis not present

## 2019-09-06 DIAGNOSIS — R471 Dysarthria and anarthria: Secondary | ICD-10-CM | POA: Diagnosis not present

## 2019-09-06 DIAGNOSIS — R1312 Dysphagia, oropharyngeal phase: Secondary | ICD-10-CM | POA: Diagnosis not present

## 2019-09-06 DIAGNOSIS — M545 Low back pain: Secondary | ICD-10-CM | POA: Diagnosis not present

## 2019-09-06 DIAGNOSIS — R627 Adult failure to thrive: Secondary | ICD-10-CM | POA: Diagnosis not present

## 2019-09-06 DIAGNOSIS — Z431 Encounter for attention to gastrostomy: Secondary | ICD-10-CM | POA: Diagnosis not present

## 2019-09-06 DIAGNOSIS — G8929 Other chronic pain: Secondary | ICD-10-CM | POA: Diagnosis not present

## 2019-09-06 DIAGNOSIS — I69391 Dysphagia following cerebral infarction: Secondary | ICD-10-CM | POA: Diagnosis not present

## 2019-09-06 DIAGNOSIS — E43 Unspecified severe protein-calorie malnutrition: Secondary | ICD-10-CM | POA: Diagnosis not present

## 2019-09-07 ENCOUNTER — Telehealth: Payer: Self-pay | Admitting: Internal Medicine

## 2019-09-07 NOTE — Telephone Encounter (Signed)
Phone call placed to patient to offer to schedule a visit with [facility]. Phone rang, with no answer I left a voicemail for call back. 

## 2019-09-08 DIAGNOSIS — R471 Dysarthria and anarthria: Secondary | ICD-10-CM | POA: Diagnosis not present

## 2019-09-08 DIAGNOSIS — I69391 Dysphagia following cerebral infarction: Secondary | ICD-10-CM | POA: Diagnosis not present

## 2019-09-08 DIAGNOSIS — I959 Hypotension, unspecified: Secondary | ICD-10-CM | POA: Diagnosis not present

## 2019-09-08 DIAGNOSIS — G8929 Other chronic pain: Secondary | ICD-10-CM | POA: Diagnosis not present

## 2019-09-08 DIAGNOSIS — L89153 Pressure ulcer of sacral region, stage 3: Secondary | ICD-10-CM | POA: Diagnosis not present

## 2019-09-08 DIAGNOSIS — R1312 Dysphagia, oropharyngeal phase: Secondary | ICD-10-CM | POA: Diagnosis not present

## 2019-09-08 DIAGNOSIS — Z431 Encounter for attention to gastrostomy: Secondary | ICD-10-CM | POA: Diagnosis not present

## 2019-09-08 DIAGNOSIS — M545 Low back pain: Secondary | ICD-10-CM | POA: Diagnosis not present

## 2019-09-08 DIAGNOSIS — I10 Essential (primary) hypertension: Secondary | ICD-10-CM | POA: Diagnosis not present

## 2019-09-08 DIAGNOSIS — E43 Unspecified severe protein-calorie malnutrition: Secondary | ICD-10-CM | POA: Diagnosis not present

## 2019-09-08 DIAGNOSIS — R627 Adult failure to thrive: Secondary | ICD-10-CM | POA: Diagnosis not present

## 2019-09-09 DIAGNOSIS — Z431 Encounter for attention to gastrostomy: Secondary | ICD-10-CM | POA: Diagnosis not present

## 2019-09-09 DIAGNOSIS — E43 Unspecified severe protein-calorie malnutrition: Secondary | ICD-10-CM | POA: Diagnosis not present

## 2019-09-09 DIAGNOSIS — R471 Dysarthria and anarthria: Secondary | ICD-10-CM | POA: Diagnosis not present

## 2019-09-09 DIAGNOSIS — I10 Essential (primary) hypertension: Secondary | ICD-10-CM | POA: Diagnosis not present

## 2019-09-09 DIAGNOSIS — G8929 Other chronic pain: Secondary | ICD-10-CM | POA: Diagnosis not present

## 2019-09-09 DIAGNOSIS — L89153 Pressure ulcer of sacral region, stage 3: Secondary | ICD-10-CM | POA: Diagnosis not present

## 2019-09-09 DIAGNOSIS — R627 Adult failure to thrive: Secondary | ICD-10-CM | POA: Diagnosis not present

## 2019-09-09 DIAGNOSIS — I959 Hypotension, unspecified: Secondary | ICD-10-CM | POA: Diagnosis not present

## 2019-09-09 DIAGNOSIS — R1312 Dysphagia, oropharyngeal phase: Secondary | ICD-10-CM | POA: Diagnosis not present

## 2019-09-09 DIAGNOSIS — M545 Low back pain: Secondary | ICD-10-CM | POA: Diagnosis not present

## 2019-09-09 DIAGNOSIS — I69391 Dysphagia following cerebral infarction: Secondary | ICD-10-CM | POA: Diagnosis not present

## 2019-09-10 ENCOUNTER — Telehealth: Payer: Self-pay | Admitting: Internal Medicine

## 2019-09-10 DIAGNOSIS — R627 Adult failure to thrive: Secondary | ICD-10-CM | POA: Diagnosis not present

## 2019-09-10 DIAGNOSIS — I10 Essential (primary) hypertension: Secondary | ICD-10-CM | POA: Diagnosis not present

## 2019-09-10 DIAGNOSIS — M545 Low back pain: Secondary | ICD-10-CM | POA: Diagnosis not present

## 2019-09-10 DIAGNOSIS — L89153 Pressure ulcer of sacral region, stage 3: Secondary | ICD-10-CM | POA: Diagnosis not present

## 2019-09-10 DIAGNOSIS — G8929 Other chronic pain: Secondary | ICD-10-CM | POA: Diagnosis not present

## 2019-09-10 DIAGNOSIS — I959 Hypotension, unspecified: Secondary | ICD-10-CM | POA: Diagnosis not present

## 2019-09-10 DIAGNOSIS — R1312 Dysphagia, oropharyngeal phase: Secondary | ICD-10-CM | POA: Diagnosis not present

## 2019-09-10 DIAGNOSIS — Z431 Encounter for attention to gastrostomy: Secondary | ICD-10-CM | POA: Diagnosis not present

## 2019-09-10 DIAGNOSIS — I69391 Dysphagia following cerebral infarction: Secondary | ICD-10-CM | POA: Diagnosis not present

## 2019-09-10 DIAGNOSIS — R471 Dysarthria and anarthria: Secondary | ICD-10-CM | POA: Diagnosis not present

## 2019-09-10 DIAGNOSIS — E43 Unspecified severe protein-calorie malnutrition: Secondary | ICD-10-CM | POA: Diagnosis not present

## 2019-09-10 NOTE — Telephone Encounter (Signed)
Spoke with patient's wife regarding Palliative services and she was in agreement with this.  I have scheduled an In-person Consult for 09/24/19 @ 11:30 AM.

## 2019-09-13 DIAGNOSIS — E43 Unspecified severe protein-calorie malnutrition: Secondary | ICD-10-CM | POA: Diagnosis not present

## 2019-09-13 DIAGNOSIS — M545 Low back pain: Secondary | ICD-10-CM | POA: Diagnosis not present

## 2019-09-13 DIAGNOSIS — I69391 Dysphagia following cerebral infarction: Secondary | ICD-10-CM | POA: Diagnosis not present

## 2019-09-13 DIAGNOSIS — R627 Adult failure to thrive: Secondary | ICD-10-CM | POA: Diagnosis not present

## 2019-09-13 DIAGNOSIS — R471 Dysarthria and anarthria: Secondary | ICD-10-CM | POA: Diagnosis not present

## 2019-09-13 DIAGNOSIS — L89153 Pressure ulcer of sacral region, stage 3: Secondary | ICD-10-CM | POA: Diagnosis not present

## 2019-09-13 DIAGNOSIS — Z431 Encounter for attention to gastrostomy: Secondary | ICD-10-CM | POA: Diagnosis not present

## 2019-09-13 DIAGNOSIS — R1312 Dysphagia, oropharyngeal phase: Secondary | ICD-10-CM | POA: Diagnosis not present

## 2019-09-13 DIAGNOSIS — I959 Hypotension, unspecified: Secondary | ICD-10-CM | POA: Diagnosis not present

## 2019-09-13 DIAGNOSIS — G8929 Other chronic pain: Secondary | ICD-10-CM | POA: Diagnosis not present

## 2019-09-13 DIAGNOSIS — I10 Essential (primary) hypertension: Secondary | ICD-10-CM | POA: Diagnosis not present

## 2019-09-14 DIAGNOSIS — I69391 Dysphagia following cerebral infarction: Secondary | ICD-10-CM | POA: Diagnosis not present

## 2019-09-14 DIAGNOSIS — I10 Essential (primary) hypertension: Secondary | ICD-10-CM | POA: Diagnosis not present

## 2019-09-14 DIAGNOSIS — G8929 Other chronic pain: Secondary | ICD-10-CM | POA: Diagnosis not present

## 2019-09-14 DIAGNOSIS — I959 Hypotension, unspecified: Secondary | ICD-10-CM | POA: Diagnosis not present

## 2019-09-14 DIAGNOSIS — Z431 Encounter for attention to gastrostomy: Secondary | ICD-10-CM | POA: Diagnosis not present

## 2019-09-14 DIAGNOSIS — R1312 Dysphagia, oropharyngeal phase: Secondary | ICD-10-CM | POA: Diagnosis not present

## 2019-09-14 DIAGNOSIS — E43 Unspecified severe protein-calorie malnutrition: Secondary | ICD-10-CM | POA: Diagnosis not present

## 2019-09-14 DIAGNOSIS — R627 Adult failure to thrive: Secondary | ICD-10-CM | POA: Diagnosis not present

## 2019-09-14 DIAGNOSIS — M545 Low back pain: Secondary | ICD-10-CM | POA: Diagnosis not present

## 2019-09-14 DIAGNOSIS — L89153 Pressure ulcer of sacral region, stage 3: Secondary | ICD-10-CM | POA: Diagnosis not present

## 2019-09-14 DIAGNOSIS — R471 Dysarthria and anarthria: Secondary | ICD-10-CM | POA: Diagnosis not present

## 2019-09-15 DIAGNOSIS — R1312 Dysphagia, oropharyngeal phase: Secondary | ICD-10-CM | POA: Diagnosis not present

## 2019-09-15 DIAGNOSIS — R471 Dysarthria and anarthria: Secondary | ICD-10-CM | POA: Diagnosis not present

## 2019-09-15 DIAGNOSIS — E119 Type 2 diabetes mellitus without complications: Secondary | ICD-10-CM | POA: Diagnosis not present

## 2019-09-15 DIAGNOSIS — Z299 Encounter for prophylactic measures, unspecified: Secondary | ICD-10-CM | POA: Diagnosis not present

## 2019-09-15 DIAGNOSIS — I10 Essential (primary) hypertension: Secondary | ICD-10-CM | POA: Diagnosis not present

## 2019-09-15 DIAGNOSIS — L89153 Pressure ulcer of sacral region, stage 3: Secondary | ICD-10-CM | POA: Diagnosis not present

## 2019-09-15 DIAGNOSIS — I959 Hypotension, unspecified: Secondary | ICD-10-CM | POA: Diagnosis not present

## 2019-09-15 DIAGNOSIS — Z431 Encounter for attention to gastrostomy: Secondary | ICD-10-CM | POA: Diagnosis not present

## 2019-09-15 DIAGNOSIS — E7800 Pure hypercholesterolemia, unspecified: Secondary | ICD-10-CM | POA: Diagnosis not present

## 2019-09-15 DIAGNOSIS — I69391 Dysphagia following cerebral infarction: Secondary | ICD-10-CM | POA: Diagnosis not present

## 2019-09-15 DIAGNOSIS — G8194 Hemiplegia, unspecified affecting left nondominant side: Secondary | ICD-10-CM | POA: Diagnosis not present

## 2019-09-15 DIAGNOSIS — E039 Hypothyroidism, unspecified: Secondary | ICD-10-CM | POA: Diagnosis not present

## 2019-09-15 DIAGNOSIS — M159 Polyosteoarthritis, unspecified: Secondary | ICD-10-CM | POA: Diagnosis not present

## 2019-09-15 DIAGNOSIS — G8929 Other chronic pain: Secondary | ICD-10-CM | POA: Diagnosis not present

## 2019-09-15 DIAGNOSIS — R627 Adult failure to thrive: Secondary | ICD-10-CM | POA: Diagnosis not present

## 2019-09-15 DIAGNOSIS — M545 Low back pain: Secondary | ICD-10-CM | POA: Diagnosis not present

## 2019-09-15 DIAGNOSIS — G4700 Insomnia, unspecified: Secondary | ICD-10-CM | POA: Diagnosis not present

## 2019-09-15 DIAGNOSIS — E43 Unspecified severe protein-calorie malnutrition: Secondary | ICD-10-CM | POA: Diagnosis not present

## 2019-09-16 DIAGNOSIS — R627 Adult failure to thrive: Secondary | ICD-10-CM | POA: Diagnosis not present

## 2019-09-16 DIAGNOSIS — I69391 Dysphagia following cerebral infarction: Secondary | ICD-10-CM | POA: Diagnosis not present

## 2019-09-16 DIAGNOSIS — L89153 Pressure ulcer of sacral region, stage 3: Secondary | ICD-10-CM | POA: Diagnosis not present

## 2019-09-16 DIAGNOSIS — I959 Hypotension, unspecified: Secondary | ICD-10-CM | POA: Diagnosis not present

## 2019-09-16 DIAGNOSIS — Z431 Encounter for attention to gastrostomy: Secondary | ICD-10-CM | POA: Diagnosis not present

## 2019-09-16 DIAGNOSIS — M545 Low back pain: Secondary | ICD-10-CM | POA: Diagnosis not present

## 2019-09-16 DIAGNOSIS — E43 Unspecified severe protein-calorie malnutrition: Secondary | ICD-10-CM | POA: Diagnosis not present

## 2019-09-16 DIAGNOSIS — G8929 Other chronic pain: Secondary | ICD-10-CM | POA: Diagnosis not present

## 2019-09-16 DIAGNOSIS — I10 Essential (primary) hypertension: Secondary | ICD-10-CM | POA: Diagnosis not present

## 2019-09-16 DIAGNOSIS — R471 Dysarthria and anarthria: Secondary | ICD-10-CM | POA: Diagnosis not present

## 2019-09-16 DIAGNOSIS — R1312 Dysphagia, oropharyngeal phase: Secondary | ICD-10-CM | POA: Diagnosis not present

## 2019-09-17 DIAGNOSIS — G8929 Other chronic pain: Secondary | ICD-10-CM | POA: Diagnosis not present

## 2019-09-17 DIAGNOSIS — I69391 Dysphagia following cerebral infarction: Secondary | ICD-10-CM | POA: Diagnosis not present

## 2019-09-17 DIAGNOSIS — E43 Unspecified severe protein-calorie malnutrition: Secondary | ICD-10-CM | POA: Diagnosis not present

## 2019-09-17 DIAGNOSIS — Z431 Encounter for attention to gastrostomy: Secondary | ICD-10-CM | POA: Diagnosis not present

## 2019-09-17 DIAGNOSIS — L89153 Pressure ulcer of sacral region, stage 3: Secondary | ICD-10-CM | POA: Diagnosis not present

## 2019-09-17 DIAGNOSIS — I10 Essential (primary) hypertension: Secondary | ICD-10-CM | POA: Diagnosis not present

## 2019-09-17 DIAGNOSIS — R471 Dysarthria and anarthria: Secondary | ICD-10-CM | POA: Diagnosis not present

## 2019-09-17 DIAGNOSIS — I959 Hypotension, unspecified: Secondary | ICD-10-CM | POA: Diagnosis not present

## 2019-09-17 DIAGNOSIS — R1312 Dysphagia, oropharyngeal phase: Secondary | ICD-10-CM | POA: Diagnosis not present

## 2019-09-17 DIAGNOSIS — R627 Adult failure to thrive: Secondary | ICD-10-CM | POA: Diagnosis not present

## 2019-09-17 DIAGNOSIS — M545 Low back pain: Secondary | ICD-10-CM | POA: Diagnosis not present

## 2019-09-19 DIAGNOSIS — A419 Sepsis, unspecified organism: Secondary | ICD-10-CM | POA: Diagnosis not present

## 2019-09-19 DIAGNOSIS — Z931 Gastrostomy status: Secondary | ICD-10-CM | POA: Diagnosis not present

## 2019-09-21 DIAGNOSIS — M545 Low back pain: Secondary | ICD-10-CM | POA: Diagnosis not present

## 2019-09-21 DIAGNOSIS — R1312 Dysphagia, oropharyngeal phase: Secondary | ICD-10-CM | POA: Diagnosis not present

## 2019-09-21 DIAGNOSIS — E039 Hypothyroidism, unspecified: Secondary | ICD-10-CM | POA: Diagnosis not present

## 2019-09-21 DIAGNOSIS — L89153 Pressure ulcer of sacral region, stage 3: Secondary | ICD-10-CM | POA: Diagnosis not present

## 2019-09-21 DIAGNOSIS — I959 Hypotension, unspecified: Secondary | ICD-10-CM | POA: Diagnosis not present

## 2019-09-21 DIAGNOSIS — I739 Peripheral vascular disease, unspecified: Secondary | ICD-10-CM | POA: Diagnosis not present

## 2019-09-21 DIAGNOSIS — E43 Unspecified severe protein-calorie malnutrition: Secondary | ICD-10-CM | POA: Diagnosis not present

## 2019-09-21 DIAGNOSIS — Z299 Encounter for prophylactic measures, unspecified: Secondary | ICD-10-CM | POA: Diagnosis not present

## 2019-09-21 DIAGNOSIS — I69391 Dysphagia following cerebral infarction: Secondary | ICD-10-CM | POA: Diagnosis not present

## 2019-09-21 DIAGNOSIS — R627 Adult failure to thrive: Secondary | ICD-10-CM | POA: Diagnosis not present

## 2019-09-21 DIAGNOSIS — I10 Essential (primary) hypertension: Secondary | ICD-10-CM | POA: Diagnosis not present

## 2019-09-21 DIAGNOSIS — G8929 Other chronic pain: Secondary | ICD-10-CM | POA: Diagnosis not present

## 2019-09-21 DIAGNOSIS — R471 Dysarthria and anarthria: Secondary | ICD-10-CM | POA: Diagnosis not present

## 2019-09-21 DIAGNOSIS — Z431 Encounter for attention to gastrostomy: Secondary | ICD-10-CM | POA: Diagnosis not present

## 2019-09-22 DIAGNOSIS — R1312 Dysphagia, oropharyngeal phase: Secondary | ICD-10-CM | POA: Diagnosis not present

## 2019-09-22 DIAGNOSIS — M545 Low back pain: Secondary | ICD-10-CM | POA: Diagnosis not present

## 2019-09-22 DIAGNOSIS — R471 Dysarthria and anarthria: Secondary | ICD-10-CM | POA: Diagnosis not present

## 2019-09-22 DIAGNOSIS — R627 Adult failure to thrive: Secondary | ICD-10-CM | POA: Diagnosis not present

## 2019-09-22 DIAGNOSIS — L89153 Pressure ulcer of sacral region, stage 3: Secondary | ICD-10-CM | POA: Diagnosis not present

## 2019-09-22 DIAGNOSIS — Z431 Encounter for attention to gastrostomy: Secondary | ICD-10-CM | POA: Diagnosis not present

## 2019-09-22 DIAGNOSIS — G8929 Other chronic pain: Secondary | ICD-10-CM | POA: Diagnosis not present

## 2019-09-22 DIAGNOSIS — I10 Essential (primary) hypertension: Secondary | ICD-10-CM | POA: Diagnosis not present

## 2019-09-22 DIAGNOSIS — I959 Hypotension, unspecified: Secondary | ICD-10-CM | POA: Diagnosis not present

## 2019-09-22 DIAGNOSIS — I69391 Dysphagia following cerebral infarction: Secondary | ICD-10-CM | POA: Diagnosis not present

## 2019-09-22 DIAGNOSIS — E43 Unspecified severe protein-calorie malnutrition: Secondary | ICD-10-CM | POA: Diagnosis not present

## 2019-09-23 ENCOUNTER — Other Ambulatory Visit (HOSPITAL_COMMUNITY): Payer: Self-pay | Admitting: Specialist

## 2019-09-23 DIAGNOSIS — I6389 Other cerebral infarction: Secondary | ICD-10-CM

## 2019-09-23 DIAGNOSIS — R627 Adult failure to thrive: Secondary | ICD-10-CM | POA: Diagnosis not present

## 2019-09-23 DIAGNOSIS — R1319 Other dysphagia: Secondary | ICD-10-CM

## 2019-09-23 DIAGNOSIS — Z7189 Other specified counseling: Secondary | ICD-10-CM | POA: Diagnosis not present

## 2019-09-23 DIAGNOSIS — Z0000 Encounter for general adult medical examination without abnormal findings: Secondary | ICD-10-CM | POA: Diagnosis not present

## 2019-09-23 DIAGNOSIS — I69391 Dysphagia following cerebral infarction: Secondary | ICD-10-CM | POA: Diagnosis not present

## 2019-09-23 DIAGNOSIS — G8929 Other chronic pain: Secondary | ICD-10-CM | POA: Diagnosis not present

## 2019-09-23 DIAGNOSIS — L89153 Pressure ulcer of sacral region, stage 3: Secondary | ICD-10-CM | POA: Diagnosis not present

## 2019-09-23 DIAGNOSIS — A419 Sepsis, unspecified organism: Secondary | ICD-10-CM | POA: Diagnosis not present

## 2019-09-23 DIAGNOSIS — Z931 Gastrostomy status: Secondary | ICD-10-CM | POA: Diagnosis not present

## 2019-09-23 DIAGNOSIS — I959 Hypotension, unspecified: Secondary | ICD-10-CM | POA: Diagnosis not present

## 2019-09-23 DIAGNOSIS — I10 Essential (primary) hypertension: Secondary | ICD-10-CM | POA: Diagnosis not present

## 2019-09-23 DIAGNOSIS — E43 Unspecified severe protein-calorie malnutrition: Secondary | ICD-10-CM | POA: Diagnosis not present

## 2019-09-23 DIAGNOSIS — M545 Low back pain: Secondary | ICD-10-CM | POA: Diagnosis not present

## 2019-09-23 DIAGNOSIS — R471 Dysarthria and anarthria: Secondary | ICD-10-CM | POA: Diagnosis not present

## 2019-09-23 DIAGNOSIS — Z299 Encounter for prophylactic measures, unspecified: Secondary | ICD-10-CM | POA: Diagnosis not present

## 2019-09-23 DIAGNOSIS — R1312 Dysphagia, oropharyngeal phase: Secondary | ICD-10-CM | POA: Diagnosis not present

## 2019-09-23 DIAGNOSIS — Z431 Encounter for attention to gastrostomy: Secondary | ICD-10-CM | POA: Diagnosis not present

## 2019-09-23 NOTE — Progress Notes (Signed)
July 9th, 2021 [facility] Palliative Care Consult Note Telephone: [phone]  Fax: [phone]  PATIENT NAME: Dale Jones DOB: [birth year] MRN: [id number]  Dale Jones [id number]  when you down the driveway their house is the first house you come to, it's a doublewide. [phone] (Home Phone)  [phone] [facility])  [phone] (Home Phone)  PRIMARY CARE PROVIDER: Glenda Chroman, MD   Dale Hammock, MD (general surgery)  REFERRING PROVIDER: Glenda Chroman, MD  [phone] (Work) [phone] (Fax) [address] [place], [place] [address] Internal Medicine  RESPONSIBLE PARTY: (spouse) Dale Jones [phone].  second contact number if she couldn't be reached and it is:  [phone]. Contact, family member: Dale Jones [phone]    ASSESSMENT / RECOMMENDATIONS:  1. Advance Care Planning: A. Directives: Discussed with patient and wife. Patient indicates his desire for full resuscitative efforts in the event of a cardiopulmonary arrest B. Goals of Care: To improve in strength and endurance.  2. Cognitive / Functional status:  Patient is interactive but minimally conversant. Makes needs known with gesturers, grunts, and one-to-two word sentences. Spouse reports less verbalizations. Conversations also limited by dyspnea. Insomnia managed with OTC Melatonin. Past use of Trazodone caused excessive daytime somnolenn  Patient is very restless. He is almost constantly repositioning himself in bed, adjusting his bed covers, and sitting up. OT is working with patient in the home and spouse reports increase in UE strength. Spouse reports baseline function 4 months earlier was able to stand independently by stabilizing himself on a bedside table and could exercise in place. Currently needs light one person assist to stand. He is unable to ambulate. He has an electric bed, a bedside commode, and a walker in place.  Over the last 24 hrs has had  marked urinary frequency and urgency; voiding small to larger amounts as frequently as q 10-15 min. Spouse consulted PCP Dr. Woody Jones and is awaiting a call back. Tendency towards constipation most recently managed with MgCitrate and glycerin suppository.   Gastric tube with tube feeding 24/7 at 29ml/hr. Speech Therapy is follow in the home; patient has progressed in his ability to eat foods such as applesauce and plain yogurt. He's frequently clearing his throat. Currently on honey thickened liquid. Has an appointment July 19th [facility]) for f/u swallowing study.   Low back pain (3 compressed disks) managed with hydrocodone 5-325 2 tabs bid. We discussed augmenting with tyleno  3. Family/ [employer] / Coping:  Lives with spouse Dale Jones in their double wide trailer. Second marriage; he and Dale Jones have been together [phone]. They moved to this area initially to assist patient's parents in their care. Patient has a son and daughter from first marriage. He hasn't been in contact with his son for a number of years (d/t distance/COVID) but is in communication with daughter. Patient's two sisters live close by. There is a nephew we his wife and their 2 children (both autistic) who live across the road.  Dale Jones doesn't get a lot of help; she provides 24/7 care thought she feels she can leave patient for short periods of time to shop, etc.   Patient is followed in the home by Dale Jones OT/ST/SN Dale Penta RN weekly visits [phone]). Patient has Medicaid but Dale Jones reports the coverage is limited.   4. Follow up Palliative Care Appointment Thurs 10/28/19 @ 2pm. I spent 60 minutes providing this consultation from 11:30pm-12:30pm. More than 50% of the time in this  consultation was spent coordinating communication.   HISTORY OF PRESENT ILLNESS: (history provided by spouse) Dale Jones is a 74 y.o. male with h/o strokes (#2 in 2014 with L sided weakness and expressive aphasia),  urinary retention,  HTN, hypothyroidism and skin cancer. -May 2021 hospitalized with dehydration. Peg tube placed for signs of aspiration. Subsequent rehab stay at [facility] till discharged to home June 6th.   Palliative Care was asked to help address goals of care.   CODE STATUS: Full Code  PPS: 30%  HOSPICE ELIGIBILITY/DIAGNOSIS: TBD  PAST MEDICAL HISTORY: History reviewed. No pertinent past medical history.  SOCIAL HX:  Social History   Tobacco Use  . Smoking status: Not on file  Substance Use Topics  . Alcohol use: Not on file    ALLERGIES: Not on File   PERTINENT MEDICATIONS:  Outpatient Encounter Medications as of 09/24/2019  Medication Sig  . acetaminophen (TYLENOL) 500 MG tablet Take 500 mg by mouth every 6 (six) hours as needed.  . clopidogrel (PLAVIX) 75 MG tablet Take 75 mg by mouth daily.  Marland Kitchen HYDROcodone-acetaminophen (NORCO/VICODIN) 5-325 MG tablet Take 1 tablet by mouth every 6 (six) hours as needed for moderate pain. Patient taking differently. 2 tabs bid  . levothyroxine (EUTHYROX) 150 MCG tablet Take 150 mcg by mouth daily before breakfast.  . lisinopril (ZESTRIL) 10 MG tablet Take 10 mg by mouth daily.  . Melatonin 2.5 MG/10ML LIQD Take 3 mg by mouth.  . primidone (MYSOLINE) 250 MG tablet Take 250 mg by mouth daily.  . simvastatin (ZOCOR) 20 MG tablet Take 20 mg by mouth daily.  . tamsulosin (FLOMAX) 0.4 MG CAPS capsule Take 0.4 mg by mouth daily.   No facility-administered encounter medications on file as of 09/24/2019.    PHYSICAL EXAM:   BP 118/54, HR: 86, sat 96%.  General: NAD, frail appearing Cardiovascular: regular rate and rhythm Pulmonary: clear ant fields Abdomen: soft, nontender, + bowel sounds. G-tube intact  Neurological: Generalizes weakness   Julianne Handler, NP

## 2019-09-24 ENCOUNTER — Other Ambulatory Visit: Payer: Medicare Other | Admitting: Internal Medicine

## 2019-09-24 ENCOUNTER — Other Ambulatory Visit: Payer: Self-pay

## 2019-09-24 DIAGNOSIS — R471 Dysarthria and anarthria: Secondary | ICD-10-CM | POA: Diagnosis not present

## 2019-09-24 DIAGNOSIS — E43 Unspecified severe protein-calorie malnutrition: Secondary | ICD-10-CM | POA: Diagnosis not present

## 2019-09-24 DIAGNOSIS — M545 Low back pain: Secondary | ICD-10-CM | POA: Diagnosis not present

## 2019-09-24 DIAGNOSIS — Z515 Encounter for palliative care: Secondary | ICD-10-CM

## 2019-09-24 DIAGNOSIS — R1312 Dysphagia, oropharyngeal phase: Secondary | ICD-10-CM | POA: Diagnosis not present

## 2019-09-24 DIAGNOSIS — Z7189 Other specified counseling: Secondary | ICD-10-CM | POA: Diagnosis not present

## 2019-09-24 DIAGNOSIS — L89153 Pressure ulcer of sacral region, stage 3: Secondary | ICD-10-CM | POA: Diagnosis not present

## 2019-09-24 DIAGNOSIS — R627 Adult failure to thrive: Secondary | ICD-10-CM | POA: Diagnosis not present

## 2019-09-24 DIAGNOSIS — Z431 Encounter for attention to gastrostomy: Secondary | ICD-10-CM | POA: Diagnosis not present

## 2019-09-24 DIAGNOSIS — G8929 Other chronic pain: Secondary | ICD-10-CM | POA: Diagnosis not present

## 2019-09-24 DIAGNOSIS — I959 Hypotension, unspecified: Secondary | ICD-10-CM | POA: Diagnosis not present

## 2019-09-24 DIAGNOSIS — I69391 Dysphagia following cerebral infarction: Secondary | ICD-10-CM | POA: Diagnosis not present

## 2019-09-24 DIAGNOSIS — I10 Essential (primary) hypertension: Secondary | ICD-10-CM | POA: Diagnosis not present

## 2019-09-27 ENCOUNTER — Other Ambulatory Visit (HOSPITAL_COMMUNITY): Payer: Self-pay | Admitting: Specialist

## 2019-09-27 DIAGNOSIS — E43 Unspecified severe protein-calorie malnutrition: Secondary | ICD-10-CM | POA: Diagnosis not present

## 2019-09-27 DIAGNOSIS — I6389 Other cerebral infarction: Secondary | ICD-10-CM

## 2019-09-27 DIAGNOSIS — I69391 Dysphagia following cerebral infarction: Secondary | ICD-10-CM | POA: Diagnosis not present

## 2019-09-27 DIAGNOSIS — Z431 Encounter for attention to gastrostomy: Secondary | ICD-10-CM | POA: Diagnosis not present

## 2019-09-27 DIAGNOSIS — R627 Adult failure to thrive: Secondary | ICD-10-CM | POA: Diagnosis not present

## 2019-09-27 DIAGNOSIS — I959 Hypotension, unspecified: Secondary | ICD-10-CM | POA: Diagnosis not present

## 2019-09-27 DIAGNOSIS — R471 Dysarthria and anarthria: Secondary | ICD-10-CM | POA: Diagnosis not present

## 2019-09-27 DIAGNOSIS — I10 Essential (primary) hypertension: Secondary | ICD-10-CM | POA: Diagnosis not present

## 2019-09-27 DIAGNOSIS — M545 Low back pain: Secondary | ICD-10-CM | POA: Diagnosis not present

## 2019-09-27 DIAGNOSIS — R1312 Dysphagia, oropharyngeal phase: Secondary | ICD-10-CM | POA: Diagnosis not present

## 2019-09-27 DIAGNOSIS — R1319 Other dysphagia: Secondary | ICD-10-CM

## 2019-09-27 DIAGNOSIS — G8929 Other chronic pain: Secondary | ICD-10-CM | POA: Diagnosis not present

## 2019-09-27 DIAGNOSIS — L89153 Pressure ulcer of sacral region, stage 3: Secondary | ICD-10-CM | POA: Diagnosis not present

## 2019-09-28 ENCOUNTER — Encounter: Payer: Self-pay | Admitting: Internal Medicine

## 2019-09-28 DIAGNOSIS — R471 Dysarthria and anarthria: Secondary | ICD-10-CM | POA: Diagnosis not present

## 2019-09-28 DIAGNOSIS — G8929 Other chronic pain: Secondary | ICD-10-CM | POA: Diagnosis not present

## 2019-09-28 DIAGNOSIS — C4490 Unspecified malignant neoplasm of skin, unspecified: Secondary | ICD-10-CM | POA: Insufficient documentation

## 2019-09-28 DIAGNOSIS — L89153 Pressure ulcer of sacral region, stage 3: Secondary | ICD-10-CM | POA: Diagnosis not present

## 2019-09-28 DIAGNOSIS — I639 Cerebral infarction, unspecified: Secondary | ICD-10-CM | POA: Insufficient documentation

## 2019-09-28 DIAGNOSIS — I69391 Dysphagia following cerebral infarction: Secondary | ICD-10-CM | POA: Diagnosis not present

## 2019-09-28 DIAGNOSIS — R339 Retention of urine, unspecified: Secondary | ICD-10-CM | POA: Insufficient documentation

## 2019-09-28 DIAGNOSIS — E039 Hypothyroidism, unspecified: Secondary | ICD-10-CM | POA: Insufficient documentation

## 2019-09-28 DIAGNOSIS — I959 Hypotension, unspecified: Secondary | ICD-10-CM | POA: Diagnosis not present

## 2019-09-28 DIAGNOSIS — M545 Low back pain: Secondary | ICD-10-CM | POA: Diagnosis not present

## 2019-09-28 DIAGNOSIS — I10 Essential (primary) hypertension: Secondary | ICD-10-CM | POA: Diagnosis not present

## 2019-09-28 DIAGNOSIS — Z431 Encounter for attention to gastrostomy: Secondary | ICD-10-CM | POA: Diagnosis not present

## 2019-09-28 DIAGNOSIS — E43 Unspecified severe protein-calorie malnutrition: Secondary | ICD-10-CM | POA: Diagnosis not present

## 2019-09-28 DIAGNOSIS — R627 Adult failure to thrive: Secondary | ICD-10-CM | POA: Diagnosis not present

## 2019-09-28 DIAGNOSIS — R1312 Dysphagia, oropharyngeal phase: Secondary | ICD-10-CM | POA: Diagnosis not present

## 2019-09-29 ENCOUNTER — Ambulatory Visit (HOSPITAL_COMMUNITY): Payer: Medicare Other | Admitting: Speech Pathology

## 2019-09-29 DIAGNOSIS — M545 Low back pain: Secondary | ICD-10-CM | POA: Diagnosis not present

## 2019-09-29 DIAGNOSIS — R471 Dysarthria and anarthria: Secondary | ICD-10-CM | POA: Diagnosis not present

## 2019-09-29 DIAGNOSIS — E43 Unspecified severe protein-calorie malnutrition: Secondary | ICD-10-CM | POA: Diagnosis not present

## 2019-09-29 DIAGNOSIS — L89153 Pressure ulcer of sacral region, stage 3: Secondary | ICD-10-CM | POA: Diagnosis not present

## 2019-09-29 DIAGNOSIS — R1312 Dysphagia, oropharyngeal phase: Secondary | ICD-10-CM | POA: Diagnosis not present

## 2019-09-29 DIAGNOSIS — I69391 Dysphagia following cerebral infarction: Secondary | ICD-10-CM | POA: Diagnosis not present

## 2019-09-29 DIAGNOSIS — Z431 Encounter for attention to gastrostomy: Secondary | ICD-10-CM | POA: Diagnosis not present

## 2019-09-29 DIAGNOSIS — I959 Hypotension, unspecified: Secondary | ICD-10-CM | POA: Diagnosis not present

## 2019-09-29 DIAGNOSIS — R627 Adult failure to thrive: Secondary | ICD-10-CM | POA: Diagnosis not present

## 2019-09-29 DIAGNOSIS — G8929 Other chronic pain: Secondary | ICD-10-CM | POA: Diagnosis not present

## 2019-09-29 DIAGNOSIS — I10 Essential (primary) hypertension: Secondary | ICD-10-CM | POA: Diagnosis not present

## 2019-09-30 DIAGNOSIS — I69391 Dysphagia following cerebral infarction: Secondary | ICD-10-CM | POA: Diagnosis not present

## 2019-09-30 DIAGNOSIS — G8929 Other chronic pain: Secondary | ICD-10-CM | POA: Diagnosis not present

## 2019-09-30 DIAGNOSIS — R471 Dysarthria and anarthria: Secondary | ICD-10-CM | POA: Diagnosis not present

## 2019-09-30 DIAGNOSIS — I10 Essential (primary) hypertension: Secondary | ICD-10-CM | POA: Diagnosis not present

## 2019-09-30 DIAGNOSIS — Z431 Encounter for attention to gastrostomy: Secondary | ICD-10-CM | POA: Diagnosis not present

## 2019-09-30 DIAGNOSIS — R627 Adult failure to thrive: Secondary | ICD-10-CM | POA: Diagnosis not present

## 2019-09-30 DIAGNOSIS — I959 Hypotension, unspecified: Secondary | ICD-10-CM | POA: Diagnosis not present

## 2019-09-30 DIAGNOSIS — R1312 Dysphagia, oropharyngeal phase: Secondary | ICD-10-CM | POA: Diagnosis not present

## 2019-09-30 DIAGNOSIS — E43 Unspecified severe protein-calorie malnutrition: Secondary | ICD-10-CM | POA: Diagnosis not present

## 2019-09-30 DIAGNOSIS — M545 Low back pain: Secondary | ICD-10-CM | POA: Diagnosis not present

## 2019-09-30 DIAGNOSIS — L89153 Pressure ulcer of sacral region, stage 3: Secondary | ICD-10-CM | POA: Diagnosis not present

## 2019-10-01 DIAGNOSIS — G8929 Other chronic pain: Secondary | ICD-10-CM | POA: Diagnosis not present

## 2019-10-01 DIAGNOSIS — R1312 Dysphagia, oropharyngeal phase: Secondary | ICD-10-CM | POA: Diagnosis not present

## 2019-10-01 DIAGNOSIS — I959 Hypotension, unspecified: Secondary | ICD-10-CM | POA: Diagnosis not present

## 2019-10-01 DIAGNOSIS — E43 Unspecified severe protein-calorie malnutrition: Secondary | ICD-10-CM | POA: Diagnosis not present

## 2019-10-01 DIAGNOSIS — I10 Essential (primary) hypertension: Secondary | ICD-10-CM | POA: Diagnosis not present

## 2019-10-01 DIAGNOSIS — M545 Low back pain: Secondary | ICD-10-CM | POA: Diagnosis not present

## 2019-10-01 DIAGNOSIS — R471 Dysarthria and anarthria: Secondary | ICD-10-CM | POA: Diagnosis not present

## 2019-10-01 DIAGNOSIS — R627 Adult failure to thrive: Secondary | ICD-10-CM | POA: Diagnosis not present

## 2019-10-01 DIAGNOSIS — L89153 Pressure ulcer of sacral region, stage 3: Secondary | ICD-10-CM | POA: Diagnosis not present

## 2019-10-01 DIAGNOSIS — I69391 Dysphagia following cerebral infarction: Secondary | ICD-10-CM | POA: Diagnosis not present

## 2019-10-01 DIAGNOSIS — Z431 Encounter for attention to gastrostomy: Secondary | ICD-10-CM | POA: Diagnosis not present

## 2019-10-04 ENCOUNTER — Ambulatory Visit (HOSPITAL_COMMUNITY)
Admission: RE | Admit: 2019-10-04 | Discharge: 2019-10-04 | Disposition: A | Discharge status: Home / Self Care | Payer: Medicare Other | Source: Ambulatory Visit | Attending: Internal Medicine | Admitting: Internal Medicine

## 2019-10-04 ENCOUNTER — Ambulatory Visit (HOSPITAL_COMMUNITY): Payer: Medicare Other | Attending: Internal Medicine | Admitting: Speech Pathology

## 2019-10-04 ENCOUNTER — Encounter (HOSPITAL_COMMUNITY): Payer: Self-pay | Admitting: Speech Pathology

## 2019-10-04 ENCOUNTER — Other Ambulatory Visit: Payer: Self-pay

## 2019-10-04 DIAGNOSIS — R1319 Other dysphagia: Secondary | ICD-10-CM

## 2019-10-04 DIAGNOSIS — I6389 Other cerebral infarction: Secondary | ICD-10-CM | POA: Diagnosis not present

## 2019-10-04 DIAGNOSIS — R1312 Dysphagia, oropharyngeal phase: Secondary | ICD-10-CM | POA: Insufficient documentation

## 2019-10-04 DIAGNOSIS — R1310 Dysphagia, unspecified: Secondary | ICD-10-CM | POA: Diagnosis not present

## 2019-10-04 DIAGNOSIS — I639 Cerebral infarction, unspecified: Secondary | ICD-10-CM | POA: Diagnosis not present

## 2019-10-04 NOTE — Therapy (Signed)
[place], [place], [address] Phone: [phone]   Fax:  [phone]  Modified Barium Swallow  Patient Details  Name: Dale Jones MRN: [id number] Date of Birth: [birth year] No data recorded  Encounter Date: 10/04/2019   End of Session - 10/04/19 1350    Visit Number 1    Number of Visits 1    Authorization Type [employer]    SLP Start Time 6063    SLP Stop Time  1230    SLP Time Calculation (min) 45 min    Activity Tolerance Patient tolerated treatment well           History reviewed. No pertinent past medical history.  History reviewed. No pertinent surgical history.  There were no vitals filed for this visit.   Subjective Assessment - 10/04/19 1334    Subjective "I don't know."    Patient is accompained by: Family member    Special Tests MBSS    Currently in Pain? No/denies               General - 10/04/19 1334      General Information   Date of Onset 08/05/19    HPI Bailen Geffre is a 74 yo male who was referred for MBSS by his PCP, Dr. Jerene Bears due to dysphagia. Pt was seen at [facility] in May 2021 with reported sepsis and had a FEES completed and subsequent PEG placement. Pt consumes only a minimal amount of puree and HTL at home. His wife indicates that he had a stroke in 2014 and resulted in dysphagia, but he eventually returned to consuming regular textures and thin liquids. She also reports that Pt started having trouble swallowing in March of 2021 and had some weight loss.    Type of Study MBS-Modified Barium Swallow Study    Previous Swallow Assessment FEES May 2021, puree/HTL    Diet Prior to this Study Dysphagia 1 (puree);Honey-thick liquids;PEG tube    Temperature Spikes Noted No    Respiratory Status Room air    History of Recent Intubation No    Behavior/Cognition Alert;Cooperative;Requires cueing    Oral Cavity Assessment Within Functional Limits    Oral Care Completed  by SLP No    Oral Cavity - Dentition Edentulous    Self-Feeding Abilities Able to feed self    Patient Positioning Upright in chair    Baseline Vocal Quality Breathy    Volitional Cough Weak    Volitional Swallow Able to elicit    Anatomy Within functional limits   appearance of bone spurs C5   Pharyngeal Secretions Not observed secondary MBS              Oral Preparation/Oral Phase - 10/04/19 1336      Oral Preparation/Oral Phase   Oral Phase Impaired      Oral - Honey   Oral - Honey Cup Weak ligual manipulation;Decreased bolus cohesion;Oral residue      Oral - Nectar   Oral - Nectar Teaspoon Within functional limits    Oral - Nectar Cup Weak ligual manipulation;Decreased bolus cohesion;Oral residue      Oral - Thin   Oral - Thin Teaspoon Within functional limits    Oral - Thin Cup Decreased bolus cohesion;Weak ligual manipulation      Oral - Solids   Oral - Puree Decreased bolus cohesion;Weak ligual manipulation    Oral - Mechanical Soft Weak ligual manipulation;Imparied mastication;Decreased bolus cohesion  Electrical stimulation - Oral Phase   Was Electrical Stimulation Used No            Pharyngeal Phase - 10/04/19 1340      Pharyngeal Phase   Pharyngeal Phase Impaired      Pharyngeal - Honey   Pharyngeal- Honey Teaspoon Delayed swallow initiation;Swallow initiation at pyriform sinus;Reduced pharyngeal peristalsis;Reduced epiglottic inversion;Reduced tongue base retraction;Pharyngeal residue - valleculae;Pharyngeal residue - pyriform;Lateral channel residue      Pharyngeal - Nectar   Pharyngeal- Nectar Teaspoon Delayed swallow initiation;Swallow initiation at pyriform sinus;Reduced pharyngeal peristalsis;Reduced epiglottic inversion;Reduced tongue base retraction;Pharyngeal residue - valleculae;Pharyngeal residue - pyriform;Lateral channel residue;Penetration/Aspiration before swallow;Reduced airway/laryngeal closure;Trace aspiration    Pharyngeal  Material enters airway, passes BELOW cords without attempt by patient to eject out (silent aspiration);Material enters airway, remains ABOVE vocal cords then ejected out    Pharyngeal- Nectar Cup Delayed swallow initiation;Swallow initiation at pyriform sinus;Reduced pharyngeal peristalsis;Reduced epiglottic inversion;Reduced airway/laryngeal closure;Reduced tongue base retraction;Penetration/Aspiration during swallow;Pharyngeal residue - valleculae;Pharyngeal residue - pyriform;Lateral channel residue    Pharyngeal Material enters airway, CONTACTS cords and then ejected out      Pharyngeal - Thin   Pharyngeal- Thin Teaspoon Swallow initiation at pyriform sinus;Delayed swallow initiation;Reduced pharyngeal peristalsis;Reduced epiglottic inversion;Reduced airway/laryngeal closure;Reduced tongue base retraction;Penetration/Aspiration during swallow;Penetration/Apiration after swallow;Pharyngeal residue - valleculae;Pharyngeal residue - pyriform;Lateral channel residue    Pharyngeal Material enters airway, CONTACTS cords and not ejected out    Pharyngeal- Thin Cup Delayed swallow initiation;Swallow initiation at pyriform sinus;Reduced pharyngeal peristalsis;Reduced epiglottic inversion;Reduced airway/laryngeal closure;Reduced tongue base retraction;Penetration/Aspiration before swallow;Trace aspiration;Moderate aspiration;Pharyngeal residue - valleculae;Pharyngeal residue - pyriform;Pharyngeal residue - posterior pharnyx;Lateral channel residue    Pharyngeal Material enters airway, passes BELOW cords and not ejected out despite cough attempt by patient      Pharyngeal - Solids   Pharyngeal- Puree Swallow initiation at vallecula;Reduced pharyngeal peristalsis;Reduced epiglottic inversion;Reduced tongue base retraction;Pharyngeal residue - valleculae;Pharyngeal residue - pyriform;Pharyngeal residue - cp segment    Pharyngeal- Mechanical Soft Swallow initiation at vallecula;Reduced pharyngeal  peristalsis;Reduced epiglottic inversion;Reduced tongue base retraction;Pharyngeal residue - valleculae;Pharyngeal residue - pyriform;Pharyngeal residue - cp segment      Electrical Stimulation - Pharyngeal Phase   Was Electrical Stimulation Used No            Cricopharyngeal Phase - 10/04/19 1348      Cervical Esophageal Phase   Cervical Esophageal Phase Within functional limits             Plan - 10/04/19 1351    Clinical Impression Statement Pt presents with mod/severe oropharyngeal dysphagia characterized by weak lingual manipulation and reduced oral control with premature spillage over the base of tongue (despite cueing Pt to hold liquids in his mouth), reduced tongue base retraction, epiglottic deflection, pharyngeal contraction, and reduced laryngeal vestibule closure resulting in variable penetration/aspiration before/during/after the swallow with thin and NTL in trace to min amounts. Pt with either a throat clear or strong cough in response 75% of the time, however not removed once below and in trachea. Pt with moderate vallecular, lateral channel, and pyriform sinus residue greatest with HTL, puree, and mech soft textures. Residuals decreased with repeat/dry swallows, however never completely cleared. Pt with a very weak cued cough, however his spontaneous/reflexive cough was strong. Head turn L/R was not found to be effective. Pt was unable to orally hold liquid bolus despite cues. He responded best to take small tsp presentations of liquids to swallow "fast and hard". When Pt allowed to self present cup sips, he took  very large sips and filled his pyriforms and aspirated. Pt appears very weak overall and has had limited po intake per his wife. Recommend: primary nutrition via PEG with a goal of food by mouth at least 3x/day of puree and NTL via tsp presentation with cue to swallow fast and hard; repeat dry swallows. Prognosis for improvement is questionable as SLP asked Pt whether he  wanted to eat by mouth and he shrugged his shoulders. Pt also needs to practice good oral care and implement dysphagia exercises at home if he wishes to progress. Pt and spouse were provided written information and the imaging was reviewed with both of them.           Patient will benefit from skilled therapeutic intervention in order to improve the following deficits and impairments:   Dysphagia, oropharyngeal phase     Recommendations/Treatment - 10/04/19 1348      Swallow Evaluation Recommendations   SLP Diet Recommendations Nectar;Dysphagia 1 (puree)    Liquid Administration via Spoon    Medication Administration Via alternative means   via PEG or crushed with puree   Supervision Patient able to self feed;Full supervision/cueing for compensatory strategies    Compensations Slow rate;Small sips/bites;Multiple dry swallows after each bite/sip;Effortful swallow    Postural Changes Seated upright at 90 degrees;Remain upright for at least 30 minutes after feeds/meals            Prognosis - 10/04/19 1349      Prognosis   Prognosis for Safe Diet Advancement Guarded    Barriers to Reach Goals Severity of deficits;Motivation;Cognitive deficits      Individuals Consulted   Consulted and Agree with Results and Recommendations Patient;Family member/caregiver    Family Member Consulted spouse    Report Sent to  Referring physician;Primary SLP           Problem List Patient Active Problem List   Diagnosis Date Noted  . Hypothyroidism 09/28/2019  . Stroke ([place]) 09/28/2019  . Urinary retention 09/28/2019  . Hypertension 09/28/2019  . Skin cancer 09/28/2019   Thank you,  Genene Churn, [place]  [facility] 10/04/2019, 1:52 PM  [place] [address] [place], [place], [address] Phone: [phone]   Fax:  [phone]  Name: DENORRIS REUST MRN: [id number] Date of Birth: 1945-05-09

## 2019-10-05 DIAGNOSIS — E43 Unspecified severe protein-calorie malnutrition: Secondary | ICD-10-CM | POA: Diagnosis not present

## 2019-10-05 DIAGNOSIS — I10 Essential (primary) hypertension: Secondary | ICD-10-CM | POA: Diagnosis not present

## 2019-10-05 DIAGNOSIS — G8929 Other chronic pain: Secondary | ICD-10-CM | POA: Diagnosis not present

## 2019-10-05 DIAGNOSIS — Z431 Encounter for attention to gastrostomy: Secondary | ICD-10-CM | POA: Diagnosis not present

## 2019-10-05 DIAGNOSIS — I959 Hypotension, unspecified: Secondary | ICD-10-CM | POA: Diagnosis not present

## 2019-10-05 DIAGNOSIS — R471 Dysarthria and anarthria: Secondary | ICD-10-CM | POA: Diagnosis not present

## 2019-10-05 DIAGNOSIS — R1312 Dysphagia, oropharyngeal phase: Secondary | ICD-10-CM | POA: Diagnosis not present

## 2019-10-05 DIAGNOSIS — R627 Adult failure to thrive: Secondary | ICD-10-CM | POA: Diagnosis not present

## 2019-10-05 DIAGNOSIS — I69391 Dysphagia following cerebral infarction: Secondary | ICD-10-CM | POA: Diagnosis not present

## 2019-10-05 DIAGNOSIS — L89153 Pressure ulcer of sacral region, stage 3: Secondary | ICD-10-CM | POA: Diagnosis not present

## 2019-10-05 DIAGNOSIS — M545 Low back pain: Secondary | ICD-10-CM | POA: Diagnosis not present

## 2019-10-07 DIAGNOSIS — G8929 Other chronic pain: Secondary | ICD-10-CM | POA: Diagnosis not present

## 2019-10-07 DIAGNOSIS — E43 Unspecified severe protein-calorie malnutrition: Secondary | ICD-10-CM | POA: Diagnosis not present

## 2019-10-07 DIAGNOSIS — R1312 Dysphagia, oropharyngeal phase: Secondary | ICD-10-CM | POA: Diagnosis not present

## 2019-10-07 DIAGNOSIS — R471 Dysarthria and anarthria: Secondary | ICD-10-CM | POA: Diagnosis not present

## 2019-10-07 DIAGNOSIS — L89153 Pressure ulcer of sacral region, stage 3: Secondary | ICD-10-CM | POA: Diagnosis not present

## 2019-10-07 DIAGNOSIS — M545 Low back pain: Secondary | ICD-10-CM | POA: Diagnosis not present

## 2019-10-07 DIAGNOSIS — R627 Adult failure to thrive: Secondary | ICD-10-CM | POA: Diagnosis not present

## 2019-10-07 DIAGNOSIS — I69391 Dysphagia following cerebral infarction: Secondary | ICD-10-CM | POA: Diagnosis not present

## 2019-10-07 DIAGNOSIS — I959 Hypotension, unspecified: Secondary | ICD-10-CM | POA: Diagnosis not present

## 2019-10-07 DIAGNOSIS — Z431 Encounter for attention to gastrostomy: Secondary | ICD-10-CM | POA: Diagnosis not present

## 2019-10-07 DIAGNOSIS — I10 Essential (primary) hypertension: Secondary | ICD-10-CM | POA: Diagnosis not present

## 2019-10-08 DIAGNOSIS — M79604 Pain in right leg: Secondary | ICD-10-CM | POA: Diagnosis not present

## 2019-10-08 DIAGNOSIS — E039 Hypothyroidism, unspecified: Secondary | ICD-10-CM | POA: Diagnosis not present

## 2019-10-08 DIAGNOSIS — I739 Peripheral vascular disease, unspecified: Secondary | ICD-10-CM | POA: Diagnosis not present

## 2019-10-08 DIAGNOSIS — Z299 Encounter for prophylactic measures, unspecified: Secondary | ICD-10-CM | POA: Diagnosis not present

## 2019-10-11 ENCOUNTER — Telehealth: Payer: Self-pay | Admitting: Internal Medicine

## 2019-10-11 NOTE — Telephone Encounter (Signed)
Called patient's wife, Opal Sidles, to see if I could reschedule the time of the Palliative f/u visit scheduled on 8/12 from 2 PM to 1:30 PM, and she was in agreement with this.  Will notify NP.

## 2019-10-14 DIAGNOSIS — L89153 Pressure ulcer of sacral region, stage 3: Secondary | ICD-10-CM | POA: Diagnosis not present

## 2019-10-14 DIAGNOSIS — E43 Unspecified severe protein-calorie malnutrition: Secondary | ICD-10-CM | POA: Diagnosis not present

## 2019-10-14 DIAGNOSIS — Z431 Encounter for attention to gastrostomy: Secondary | ICD-10-CM | POA: Diagnosis not present

## 2019-10-14 DIAGNOSIS — R627 Adult failure to thrive: Secondary | ICD-10-CM | POA: Diagnosis not present

## 2019-10-14 DIAGNOSIS — R1312 Dysphagia, oropharyngeal phase: Secondary | ICD-10-CM | POA: Diagnosis not present

## 2019-10-14 DIAGNOSIS — I69391 Dysphagia following cerebral infarction: Secondary | ICD-10-CM | POA: Diagnosis not present

## 2019-10-14 DIAGNOSIS — I739 Peripheral vascular disease, unspecified: Secondary | ICD-10-CM | POA: Diagnosis not present

## 2019-10-14 DIAGNOSIS — G8929 Other chronic pain: Secondary | ICD-10-CM | POA: Diagnosis not present

## 2019-10-14 DIAGNOSIS — M545 Low back pain: Secondary | ICD-10-CM | POA: Diagnosis not present

## 2019-10-14 DIAGNOSIS — I959 Hypotension, unspecified: Secondary | ICD-10-CM | POA: Diagnosis not present

## 2019-10-14 DIAGNOSIS — M79605 Pain in left leg: Secondary | ICD-10-CM | POA: Diagnosis not present

## 2019-10-14 DIAGNOSIS — I10 Essential (primary) hypertension: Secondary | ICD-10-CM | POA: Diagnosis not present

## 2019-10-14 DIAGNOSIS — Z299 Encounter for prophylactic measures, unspecified: Secondary | ICD-10-CM | POA: Diagnosis not present

## 2019-10-14 DIAGNOSIS — R471 Dysarthria and anarthria: Secondary | ICD-10-CM | POA: Diagnosis not present

## 2019-10-15 DIAGNOSIS — I959 Hypotension, unspecified: Secondary | ICD-10-CM | POA: Diagnosis not present

## 2019-10-15 DIAGNOSIS — R1312 Dysphagia, oropharyngeal phase: Secondary | ICD-10-CM | POA: Diagnosis not present

## 2019-10-15 DIAGNOSIS — G8929 Other chronic pain: Secondary | ICD-10-CM | POA: Diagnosis not present

## 2019-10-15 DIAGNOSIS — I10 Essential (primary) hypertension: Secondary | ICD-10-CM | POA: Diagnosis not present

## 2019-10-15 DIAGNOSIS — L89153 Pressure ulcer of sacral region, stage 3: Secondary | ICD-10-CM | POA: Diagnosis not present

## 2019-10-15 DIAGNOSIS — R627 Adult failure to thrive: Secondary | ICD-10-CM | POA: Diagnosis not present

## 2019-10-15 DIAGNOSIS — M545 Low back pain: Secondary | ICD-10-CM | POA: Diagnosis not present

## 2019-10-15 DIAGNOSIS — Z431 Encounter for attention to gastrostomy: Secondary | ICD-10-CM | POA: Diagnosis not present

## 2019-10-15 DIAGNOSIS — I69391 Dysphagia following cerebral infarction: Secondary | ICD-10-CM | POA: Diagnosis not present

## 2019-10-15 DIAGNOSIS — E43 Unspecified severe protein-calorie malnutrition: Secondary | ICD-10-CM | POA: Diagnosis not present

## 2019-10-15 DIAGNOSIS — R471 Dysarthria and anarthria: Secondary | ICD-10-CM | POA: Diagnosis not present

## 2019-10-15 DIAGNOSIS — E7800 Pure hypercholesterolemia, unspecified: Secondary | ICD-10-CM | POA: Diagnosis not present

## 2019-10-15 DIAGNOSIS — Z931 Gastrostomy status: Secondary | ICD-10-CM | POA: Diagnosis not present

## 2019-10-15 DIAGNOSIS — M159 Polyosteoarthritis, unspecified: Secondary | ICD-10-CM | POA: Diagnosis not present

## 2019-10-15 DIAGNOSIS — E119 Type 2 diabetes mellitus without complications: Secondary | ICD-10-CM | POA: Diagnosis not present

## 2019-10-15 DIAGNOSIS — A419 Sepsis, unspecified organism: Secondary | ICD-10-CM | POA: Diagnosis not present

## 2019-10-18 DIAGNOSIS — R627 Adult failure to thrive: Secondary | ICD-10-CM | POA: Diagnosis not present

## 2019-10-18 DIAGNOSIS — R471 Dysarthria and anarthria: Secondary | ICD-10-CM | POA: Diagnosis not present

## 2019-10-18 DIAGNOSIS — I69391 Dysphagia following cerebral infarction: Secondary | ICD-10-CM | POA: Diagnosis not present

## 2019-10-18 DIAGNOSIS — I959 Hypotension, unspecified: Secondary | ICD-10-CM | POA: Diagnosis not present

## 2019-10-18 DIAGNOSIS — L89153 Pressure ulcer of sacral region, stage 3: Secondary | ICD-10-CM | POA: Diagnosis not present

## 2019-10-18 DIAGNOSIS — Z431 Encounter for attention to gastrostomy: Secondary | ICD-10-CM | POA: Diagnosis not present

## 2019-10-18 DIAGNOSIS — I639 Cerebral infarction, unspecified: Secondary | ICD-10-CM | POA: Diagnosis not present

## 2019-10-18 DIAGNOSIS — E43 Unspecified severe protein-calorie malnutrition: Secondary | ICD-10-CM | POA: Diagnosis not present

## 2019-10-18 DIAGNOSIS — I10 Essential (primary) hypertension: Secondary | ICD-10-CM | POA: Diagnosis not present

## 2019-10-18 DIAGNOSIS — G5710 Meralgia paresthetica, unspecified lower limb: Secondary | ICD-10-CM | POA: Diagnosis not present

## 2019-10-18 DIAGNOSIS — G8929 Other chronic pain: Secondary | ICD-10-CM | POA: Diagnosis not present

## 2019-10-18 DIAGNOSIS — R1312 Dysphagia, oropharyngeal phase: Secondary | ICD-10-CM | POA: Diagnosis not present

## 2019-10-18 DIAGNOSIS — M545 Low back pain: Secondary | ICD-10-CM | POA: Diagnosis not present

## 2019-10-20 DIAGNOSIS — R531 Weakness: Secondary | ICD-10-CM | POA: Diagnosis not present

## 2019-10-20 DIAGNOSIS — E039 Hypothyroidism, unspecified: Secondary | ICD-10-CM | POA: Diagnosis not present

## 2019-10-20 DIAGNOSIS — R0902 Hypoxemia: Secondary | ICD-10-CM | POA: Diagnosis not present

## 2019-10-20 DIAGNOSIS — A419 Sepsis, unspecified organism: Secondary | ICD-10-CM | POA: Diagnosis not present

## 2019-10-20 DIAGNOSIS — Z888 Allergy status to other drugs, medicaments and biological substances status: Secondary | ICD-10-CM | POA: Diagnosis not present

## 2019-10-20 DIAGNOSIS — Z79899 Other long term (current) drug therapy: Secondary | ICD-10-CM | POA: Diagnosis not present

## 2019-10-20 DIAGNOSIS — R52 Pain, unspecified: Secondary | ICD-10-CM | POA: Diagnosis not present

## 2019-10-20 DIAGNOSIS — Z7982 Long term (current) use of aspirin: Secondary | ICD-10-CM | POA: Diagnosis not present

## 2019-10-20 DIAGNOSIS — I491 Atrial premature depolarization: Secondary | ICD-10-CM | POA: Diagnosis not present

## 2019-10-20 DIAGNOSIS — I10 Essential (primary) hypertension: Secondary | ICD-10-CM | POA: Diagnosis not present

## 2019-10-20 DIAGNOSIS — Z20822 Contact with and (suspected) exposure to covid-19: Secondary | ICD-10-CM | POA: Diagnosis not present

## 2019-10-20 DIAGNOSIS — R4020 Unspecified coma: Secondary | ICD-10-CM | POA: Diagnosis not present

## 2019-10-20 DIAGNOSIS — R6521 Severe sepsis with septic shock: Secondary | ICD-10-CM | POA: Diagnosis not present

## 2019-10-20 DIAGNOSIS — E872 Acidosis: Secondary | ICD-10-CM | POA: Diagnosis not present

## 2019-10-20 DIAGNOSIS — R404 Transient alteration of awareness: Secondary | ICD-10-CM | POA: Diagnosis not present

## 2019-10-20 DIAGNOSIS — Z931 Gastrostomy status: Secondary | ICD-10-CM | POA: Diagnosis not present

## 2019-10-20 DIAGNOSIS — J189 Pneumonia, unspecified organism: Secondary | ICD-10-CM | POA: Diagnosis not present

## 2019-10-20 DIAGNOSIS — R9431 Abnormal electrocardiogram [ECG] [EKG]: Secondary | ICD-10-CM | POA: Diagnosis not present

## 2019-10-24 DIAGNOSIS — A419 Sepsis, unspecified organism: Secondary | ICD-10-CM | POA: Diagnosis not present

## 2019-10-24 DIAGNOSIS — Z931 Gastrostomy status: Secondary | ICD-10-CM | POA: Diagnosis not present

## 2019-11-17 DEATH — deceased

## 2021-10-12 IMAGING — RF DG SWALLOWING FUNCTION
1 series · 1 of 1 positions shown · non-contrast
Comparison: None

CLINICAL DATA: Dysphagia. Stroke

EXAM:
MODIFIED BARIUM SWALLOW
TECHNIQUE: Different consistencies of barium were administered orally to the
patient by the Speech Pathologist. Imaging of the pharynx was
performed in the lateral projection. The radiologist was present in
the fluoroscopy room for this study, providing personal supervision.
FLUOROSCOPY TIME:  Fluoroscopy Time:  6 minutes 18 seconds
Radiation Exposure Index (if provided by the fluoroscopic device):
Not provided
Number of Acquired Spot Images: multiple fluoroscopic screen
captures

[Series 1: cp_standard · 0.25mm/px · 1 of 1 slices shown]
[im 1/1]
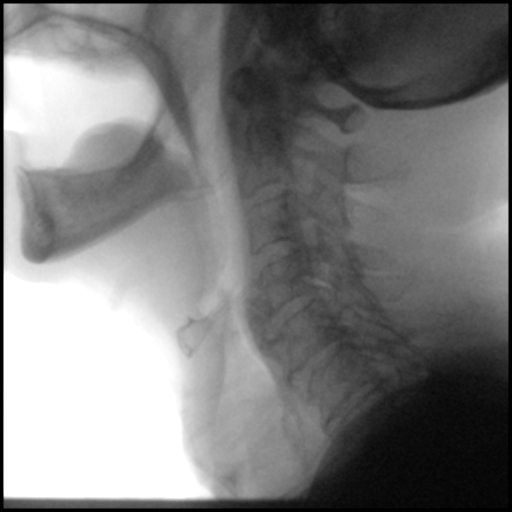

[1 of 1 positions shown; findings below may reference images not displayed]

FINDINGS: With all consistencies evaluated, premature spillover of contrast
from the oral cavity to the vallecular and piriform sinuses was
seen. LEFT pharyngeal penetration occurred with numerous swallows of
thin barium and nectar consistency by both teaspoon and cup.
Aspiration of contrast into the proximal trachea was seen with
multiple swallows, with intermittent spontaneous cough reflex. With
honey consistency, premature spillover, delayed initiation, and
flash penetration were also seen, though without aspiration.
Vallecular and piriform sinus residuals were seen with evaluated
consistencies. No laryngeal penetration or aspiration occurred with
applesauce and mechanical soft consistencies.
IMPRESSION: Significant swallowing dysfunction as above.

Please refer to the Speech Pathologists report for complete details
and recommendations.
